# Patient Record
Sex: Male | Born: 1950 | Hispanic: Yes | Marital: Married | Smoking: Never smoker
Health system: Southern US, Community
[De-identification: ages and names within clinical notes are randomized; demographics above are authoritative.]

## PROBLEM LIST (undated history)

## (undated) DIAGNOSIS — I499 Cardiac arrhythmia, unspecified: Secondary | ICD-10-CM

## (undated) DIAGNOSIS — I10 Essential (primary) hypertension: Secondary | ICD-10-CM

## (undated) DIAGNOSIS — E119 Type 2 diabetes mellitus without complications: Secondary | ICD-10-CM

## (undated) HISTORY — PX: NO PAST SURGERIES: SHX2092

---

## 2005-06-19 ENCOUNTER — Ambulatory Visit: Payer: Self-pay

## 2007-04-22 ENCOUNTER — Emergency Department: Payer: Self-pay | Admitting: Unknown Physician Specialty

## 2007-07-08 ENCOUNTER — Emergency Department: Payer: Self-pay | Admitting: Emergency Medicine

## 2008-05-21 ENCOUNTER — Emergency Department: Payer: Self-pay | Admitting: Emergency Medicine

## 2008-12-14 ENCOUNTER — Emergency Department: Payer: Self-pay | Admitting: Emergency Medicine

## 2009-11-19 ENCOUNTER — Ambulatory Visit: Payer: Self-pay | Admitting: Ophthalmology

## 2009-12-02 ENCOUNTER — Ambulatory Visit: Payer: Self-pay | Admitting: Ophthalmology

## 2013-01-08 ENCOUNTER — Ambulatory Visit: Payer: Self-pay | Admitting: Gastroenterology

## 2013-01-09 LAB — PATHOLOGY REPORT

## 2014-12-20 ENCOUNTER — Encounter: Payer: Self-pay | Admitting: Emergency Medicine

## 2014-12-20 ENCOUNTER — Emergency Department
Admission: EM | Admit: 2014-12-20 | Discharge: 2014-12-20 | Disposition: A | Discharge status: Home / Self Care | Payer: BLUE CROSS/BLUE SHIELD | Attending: Emergency Medicine | Admitting: Emergency Medicine

## 2014-12-20 DIAGNOSIS — M5431 Sciatica, right side: Secondary | ICD-10-CM | POA: Diagnosis not present

## 2014-12-20 DIAGNOSIS — Z720 Tobacco use: Secondary | ICD-10-CM | POA: Insufficient documentation

## 2014-12-20 DIAGNOSIS — M549 Dorsalgia, unspecified: Secondary | ICD-10-CM | POA: Diagnosis present

## 2014-12-20 MED ORDER — HYDROCODONE-ACETAMINOPHEN 5-325 MG PO TABS: 1.0000 | ORAL_TABLET | ORAL | Status: DC | PRN

## 2014-12-20 MED ORDER — CYCLOBENZAPRINE HCL 10 MG PO TABS: 10.0000 mg | ORAL_TABLET | Freq: Three times a day (TID) | ORAL | Status: DC | PRN

## 2014-12-20 MED ORDER — PREDNISONE 10 MG (21) PO TBPK: ORAL_TABLET | ORAL | Status: DC

## 2014-12-20 NOTE — ED Provider Notes (Signed)
[facility] Emergency Department Provider Note ____________________________________________  Time seen: Approximately 2:20 PM  I have reviewed the triage vital signs and the nursing notes.   HISTORY  Chief Complaint Back Pain   HPI Bryan Shah is a 64 y.o. male who presents to the emergency department for evaluation of back pain. He states that the back pain, comes and goes. He works in [employer] and works many days in a row. He states this time the pain is in the right side and radiates down into his foot. He has not taken any medications at home for the pain.   History reviewed. No pertinent past medical history.  There are no active problems to display for this patient.   History reviewed. No pertinent past surgical history.  Current Outpatient Rx  Name  Route  Sig  Dispense  Refill  . cyclobenzaprine (FLEXERIL) 10 MG tablet   Oral   Take 1 tablet (10 mg total) by mouth 3 (three) times daily as needed for muscle spasms.   30 tablet   0   . HYDROcodone-acetaminophen (NORCO/VICODIN) 5-325 MG per tablet   Oral   Take 1 tablet by mouth every 4 (four) hours as needed.   12 tablet   0   . predniSONE (STERAPRED UNI-PAK 21 TAB) 10 MG (21) TBPK tablet      Take 6 tablets on day 1 Take 5 tablets on day 2 Take 4 tablets on day 3 Take 3 tablets on day 4 Take 2 tablets on day 5 Take 1 tablet on day 6   21 tablet   0     Allergies Review of patient's allergies indicates no known allergies.  No family history on file.  Social History Social History  Substance Use Topics  . Smoking status: Current Every Day Smoker  . Smokeless tobacco: None  . Alcohol Use: No    Review of Systems Constitutional: No recent illness. Eyes: No visual changes. ENT: No sore throat. Cardiovascular: Denies chest pain or palpitations. Respiratory: Denies shortness of breath. Gastrointestinal: No abdominal pain.  Genitourinary: Negative for  dysuria. Musculoskeletal: Pain in right lumbar area into the leg Skin: Negative for rash. Neurological: Negative for headaches, focal weakness or numbness. 10-point ROS otherwise negative.  ____________________________________________   PHYSICAL EXAM:  VITAL SIGNS: ED Triage Vitals  Enc Vitals Group     BP 12/20/14 0831 155/43 mmHg     Pulse Rate 12/20/14 0831 66     Resp 12/20/14 0831 18     Temp 12/20/14 0831 97.6 F (36.4 C)     Temp Source 12/20/14 0831 Oral     SpO2 12/20/14 0831 100 %     Weight 12/20/14 0841 240 lb (108.863 kg)     Height 12/20/14 0841 5\' 5"  (1.651 m)     Head Cir --      Peak Flow --      Pain Score 12/20/14 0843 7     Pain Loc --      Pain Edu? --      Excl. in GC? --     Constitutional: Alert and oriented. Well appearing and in no acute distress. Eyes: Conjunctivae are normal. EOMI. Head: Atraumatic. Nose: No congestion/rhinnorhea. Neck: No stridor.  Respiratory: Normal respiratory effort.   Musculoskeletal: Straight leg raise positive at approximately 35 on the right side. Negative on the left Neurologic:  Normal speech and language. No gross focal neurologic deficits are appreciated. Speech is normal. No gait instability. Skin:  Skin is warm, dry and intact. Atraumatic. Psychiatric: Mood and affect are normal. Speech and behavior are normal.  ____________________________________________   LABS (all labs ordered are listed, but only abnormal results are displayed)  Labs Reviewed - No data to display ____________________________________________  RADIOLOGY  Not indicated ____________________________________________   PROCEDURES  Procedure(s) performed: None   ____________________________________________   INITIAL IMPRESSION / ASSESSMENT AND PLAN / ED COURSE  Pertinent labs & imaging results that were available during my care of the patient were reviewed by me and considered in my medical decision making (see chart for  details).  Patient was advised to follow-up with orthopedics for symptoms that are not improving with medication. He was advised to return to the emergency room for symptoms change or worsen if he is unable schedule an appointment. ____________________________________________   FINAL CLINICAL IMPRESSION(S) / ED DIAGNOSES  Final diagnoses:  Sciatica, right       Chinita Pester, FNP 12/20/14 1422  Sharyn Creamer, MD 12/20/14 1526

## 2014-12-20 NOTE — ED Notes (Signed)
Pt c/o lower back pain that radiates into the left leg for the past 3-4 months.Marland Kitchen

## 2015-12-22 ENCOUNTER — Emergency Department: Payer: BLUE CROSS/BLUE SHIELD

## 2015-12-22 ENCOUNTER — Emergency Department
Admission: EM | Admit: 2015-12-22 | Discharge: 2015-12-22 | Disposition: A | Discharge status: Home / Self Care | Payer: BLUE CROSS/BLUE SHIELD | Attending: Emergency Medicine | Admitting: Emergency Medicine

## 2015-12-22 ENCOUNTER — Encounter: Payer: Self-pay | Admitting: Emergency Medicine

## 2015-12-22 ENCOUNTER — Other Ambulatory Visit: Payer: Self-pay

## 2015-12-22 DIAGNOSIS — M5432 Sciatica, left side: Secondary | ICD-10-CM

## 2015-12-22 DIAGNOSIS — M5431 Sciatica, right side: Secondary | ICD-10-CM

## 2015-12-22 DIAGNOSIS — M5441 Lumbago with sciatica, right side: Secondary | ICD-10-CM | POA: Insufficient documentation

## 2015-12-22 DIAGNOSIS — M5442 Lumbago with sciatica, left side: Secondary | ICD-10-CM | POA: Insufficient documentation

## 2015-12-22 DIAGNOSIS — M5440 Lumbago with sciatica, unspecified side: Secondary | ICD-10-CM

## 2015-12-22 DIAGNOSIS — M545 Low back pain: Secondary | ICD-10-CM | POA: Diagnosis present

## 2015-12-22 DIAGNOSIS — F17200 Nicotine dependence, unspecified, uncomplicated: Secondary | ICD-10-CM | POA: Insufficient documentation

## 2015-12-22 LAB — URINALYSIS COMPLETE WITH MICROSCOPIC (ARMC ONLY)
BACTERIA UA: NONE SEEN
Bilirubin Urine: NEGATIVE
GLUCOSE, UA: NEGATIVE mg/dL
Ketones, ur: NEGATIVE mg/dL
LEUKOCYTES UA: NEGATIVE
NITRITE: NEGATIVE
PH: 5 (ref 5.0–8.0)
Protein, ur: NEGATIVE mg/dL
SPECIFIC GRAVITY, URINE: 1.01 (ref 1.005–1.030)
Squamous Epithelial / LPF: NONE SEEN

## 2015-12-22 LAB — BASIC METABOLIC PANEL
ANION GAP: 4 — AB (ref 5–15)
BUN: 15 mg/dL (ref 6–20)
CALCIUM: 8.9 mg/dL (ref 8.9–10.3)
CO2: 31 mmol/L (ref 22–32)
CREATININE: 0.77 mg/dL (ref 0.61–1.24)
Chloride: 105 mmol/L (ref 101–111)
Glucose, Bld: 123 mg/dL — ABNORMAL HIGH (ref 65–99)
Potassium: 4 mmol/L (ref 3.5–5.1)
SODIUM: 140 mmol/L (ref 135–145)

## 2015-12-22 LAB — CBC WITH DIFFERENTIAL/PLATELET
BASOS PCT: 1 %
Basophils Absolute: 0.1 10*3/uL (ref 0–0.1)
EOS ABS: 0.2 10*3/uL (ref 0–0.7)
Eosinophils Relative: 3 %
HEMATOCRIT: 44.9 % (ref 40.0–52.0)
HEMOGLOBIN: 15.4 g/dL (ref 13.0–18.0)
Lymphocytes Relative: 37 %
Lymphs Abs: 2.7 10*3/uL (ref 1.0–3.6)
MCH: 30.6 pg (ref 26.0–34.0)
MCHC: 34.3 g/dL (ref 32.0–36.0)
MCV: 89 fL (ref 80.0–100.0)
MONOS PCT: 9 %
Monocytes Absolute: 0.6 10*3/uL (ref 0.2–1.0)
NEUTROS ABS: 3.8 10*3/uL (ref 1.4–6.5)
NEUTROS PCT: 50 %
Platelets: 194 10*3/uL (ref 150–440)
RBC: 5.04 MIL/uL (ref 4.40–5.90)
RDW: 13.5 % (ref 11.5–14.5)
WBC: 7.4 10*3/uL (ref 3.8–10.6)

## 2015-12-22 MED ORDER — NAPROXEN 500 MG PO TABS: 500.0000 mg | ORAL_TABLET | Freq: Two times a day (BID) | ORAL | 0 refills | Status: DC

## 2015-12-22 MED ORDER — METHOCARBAMOL 750 MG PO TABS: 750.0000 mg | ORAL_TABLET | Freq: Four times a day (QID) | ORAL | 0 refills | Status: DC

## 2015-12-22 MED ORDER — GABAPENTIN 300 MG PO CAPS: 300.0000 mg | ORAL_CAPSULE | Freq: Three times a day (TID) | ORAL | 2 refills | Status: DC

## 2015-12-22 NOTE — ED Notes (Addendum)
States he developed some lower back pain about 4-5 months ago  States pain moves into right leg  Denies any injury but states pain is worse today. Ambulates well to treatment room

## 2015-12-22 NOTE — ED Triage Notes (Signed)
Pt c/o back pain and hip pain radiating down his legs for 5 months; ambulatory with steady gait;

## 2015-12-22 NOTE — ED Provider Notes (Signed)
[facility] Emergency Department Provider Note  ____________________________________________  Time seen: Approximately 7:19 AM  I have reviewed the triage vital signs and the nursing notes.   HISTORY  Chief Complaint Back Pain and Hip Pain    HPI obtained via interpreter. Bryan Shah is a 65 y.o. male presents for evaluation of continuous upper back and low back pain radiating down both legs. 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 Patient states the pain is across back radiating down both legs with some burning, numbness and tingling associated with it. He reports taking ibuprofen only.Marland Kitchen. He denies any direct trauma. Patient additionally states that he has had having some mild chest pains on and off for the last couple weeks. Denies any shortness of breath. He reports having some urinary frequency especially at nighttime.   History reviewed. No pertinent past medical history.  There are no active problems to display for this patient.   History reviewed. No pertinent surgical history.  Prior to Admission medications   Medication Sig Start Date End Date Taking? Authorizing Provider  gabapentin (NEURONTIN) 300 MG capsule Take 1 capsule (300 mg total) by mouth 3 (three) times daily. 12/22/15 12/21/16  Charmayne Sheerharles M Arron Tetrault, PA-C  methocarbamol (ROBAXIN) 750 MG tablet Take 1 tablet (750 mg total) by mouth 4 (four) times daily. 12/22/15   Evangeline Dakinharles M Marl Seago, PA-C  naproxen (NAPROSYN) 500 MG tablet Take 1 tablet (500 mg total) by mouth 2 (two) times daily with a meal. 12/22/15   Evangeline Dakinharles M Loyalty Arentz, PA-C    Allergies Review of patient's allergies indicates no known allergies.  No family history on file.  Social History Social History  Substance Use Topics  . Smoking status: Current Every Day Smoker  . Smokeless tobacco: Never Used  . Alcohol use No    Review of Systems Constitutional: No fever/chills Cardiovascular: Positive chest  pain. Respiratory: Denies shortness of breath. Gastrointestinal: No abdominal pain.  No nausea, no vomiting.  No diarrhea.  No constipation. Genitourinary: Negative for dysuria. Positive for frequency especially at night. Musculoskeletal: Positive for both upper and lower back pain with radiation down bilateral legs. Skin: Negative for rash. Neurological: Negative for headaches, focal weakness or numbness.  10-point ROS otherwise negative.  ____________________________________________   PHYSICAL EXAM:  VITAL SIGNS: ED Triage Vitals [12/22/15 0659]  Enc Vitals Group     BP (!) 163/55     Pulse Rate (!) 54     Resp 18     Temp 97.9 F (36.6 C)     Temp Source Oral     SpO2 99 %     Weight 244 lb (110.7 kg)     Height      Head Circumference      Peak Flow      Pain Score      Pain Loc      Pain Edu?      Excl. in GC?     Constitutional: Alert and oriented. Well appearing and in no acute distress.tous. Neck: No stridor.Supple, full range of motion, nontender.   Cardiovascular: Normal rate, regular rhythm. Grossly normal heart sounds.  Good peripheral circulation. Respiratory: Normal respiratory effort.  No retractions. Lungs CTAB. Gastrointestinal: Soft and nontender. No distention. No abdominal bruits. No CVA tenderness. Musculoskeletal: No lower extremity tenderness nor edema.  No joint effusions. Straight leg raise negative bilaterally area distally neurovascularly intact with poor nail care noted on the feet. Neurologic:  Normal speech and language. No  gross focal neurologic deficits are appreciated. No gait instability. Skin:  Skin is warm, dry and intact. No rash noted. Psychiatric: Mood and affect are normal. Speech and behavior are normal.  ____________________________________________   LABS (all labs ordered are listed, but only abnormal results are displayed)  Labs Reviewed  BASIC METABOLIC PANEL - Abnormal; Notable for the following:       Result Value    Glucose, Bld 123 (*)    Anion gap 4 (*)    All other components within normal limits  URINALYSIS COMPLETEWITH MICROSCOPIC ([facility] ONLY) - Abnormal; Notable for the following:    Color, Urine STRAW (*)    APPearance CLEAR (*)    Hgb urine dipstick 1+ (*)    All other components within normal limits  CBC WITH DIFFERENTIAL/PLATELET   ____________________________________________  EKG  Negative for any acute STEMI. ____________________________________________  RADIOLOGY  IMPRESSION:  Negative for acute fracture or malalignment of the lumbar spine.    Grade 1 anterolisthesis of L4 on L5.    Developing facet hypertrophy spanning L4-S1.    ____________________________________________   PROCEDURES  Procedure(s) performed: None  Critical Care performed: No  ____________________________________________   INITIAL IMPRESSION / ASSESSMENT AND PLAN / ED COURSE  Pertinent labs & imaging results that were available during my care of the patient were reviewed by me and considered in my medical decision making (see chart for details). Review of the [place] CSRS was performed in accordance of the [facility] prior to dispensing any controlled drugs.  Recurrent low back pain with lumbar narrowing. We'll treat for an acute exacerbation of sciatica Rx given for gabapentin 3 times a day Naprosyn 500 mg twice a day. In addition patient will be given a mild muscle relaxer for muscle tightness and spasticity. He is to follow-up with a local health care provider or pain management if symptoms continue.  Clinical Course    ____________________________________________   FINAL CLINICAL IMPRESSION(S) / ED DIAGNOSES  Final diagnoses:  Midline low back pain with sciatica, sciatica laterality unspecified  Bilateral sciatica     This chart was dictated using voice recognition software/Dragon. Despite best efforts to proofread, errors can occur which can change the meaning. Any change was purely  unintentional.    Evangeline Dakin, PA-C 12/22/15 1610    Governor Rooks, MD 12/22/15 [phone]

## 2015-12-22 NOTE — ED Notes (Signed)
Also informed the PA that he has been having pain in chest off and on for about 2 weeks.

## 2016-07-28 ENCOUNTER — Emergency Department
Admission: EM | Admit: 2016-07-28 | Discharge: 2016-07-28 | Disposition: A | Discharge status: Home / Self Care | Payer: BLUE CROSS/BLUE SHIELD | Attending: Emergency Medicine | Admitting: Emergency Medicine

## 2016-07-28 DIAGNOSIS — K644 Residual hemorrhoidal skin tags: Secondary | ICD-10-CM | POA: Insufficient documentation

## 2016-07-28 DIAGNOSIS — F17200 Nicotine dependence, unspecified, uncomplicated: Secondary | ICD-10-CM | POA: Diagnosis not present

## 2016-07-28 DIAGNOSIS — K6289 Other specified diseases of anus and rectum: Secondary | ICD-10-CM | POA: Diagnosis present

## 2016-07-28 MED ORDER — HYDROCORTISONE ACETATE 25 MG RE SUPP: 25.0000 mg | Freq: Two times a day (BID) | RECTAL | 1 refills | Status: AC | PRN

## 2016-07-28 MED ORDER — HYDROCORTISONE ACETATE 25 MG RE SUPP
25.0000 mg | Freq: Once | RECTAL | Status: AC
  Administered 2016-07-28: 25 mg via RECTAL
  Filled 2016-07-28: qty 1

## 2016-07-28 MED ORDER — LIDOCAINE VISCOUS 2 % MT SOLN
15.0000 mL | Freq: Once | OROMUCOSAL | Status: AC
  Administered 2016-07-28: 15 mL via OROMUCOSAL
  Filled 2016-07-28: qty 15

## 2016-07-28 NOTE — ED Notes (Addendum)
Pharmacy called about [facility], states they will tube it to the ED.

## 2016-07-28 NOTE — ED Provider Notes (Signed)
[facility] Emergency Department Provider Note   First MD Initiated Contact with Patient 07/28/16 [phone]     (approximate)  I have reviewed the triage vital signs and the nursing notes.   HISTORY  Chief Complaint Abscess    HPI Bryan Shah is a 66 y.o. male presents to the emergency department with painful "pimple" to his buttocks. Patient states that he's had a pimple adjacent to his anus for approximately a year however since Monday there has become tender. Patient states current pain score is 5 out of 10. Patient denies any fever  Past medical history None There are no active problems to display for this patient.   Past Surgical history None  Prior to Admission medications   Medication Sig Start Date End Date Taking? Authorizing Provider  gabapentin (NEURONTIN) 300 MG capsule Take 1 capsule (300 mg total) by mouth 3 (three) times daily. 12/22/15 12/21/16  Charmayne Sheer Beers, PA-C  hydrocortisone (ANUSOL-HC) 25 MG suppository Place 1 suppository (25 mg total) rectally 2 (two) times daily as needed for hemorrhoids or itching. 07/28/16 08/03/16  Darci Current, MD  methocarbamol (ROBAXIN) 750 MG tablet Take 1 tablet (750 mg total) by mouth 4 (four) times daily. 12/22/15   Evangeline Dakin, PA-C  naproxen (NAPROSYN) 500 MG tablet Take 1 tablet (500 mg total) by mouth 2 (two) times daily with a meal. 12/22/15   Evangeline Dakin, PA-C    Allergies No known drug allergies No family history on file.  Social History Social History  Substance Use Topics  . Smoking status: Current Every Day Smoker  . Smokeless tobacco: Never Used  . Alcohol use No    Review of Systems Constitutional: No fever/chills Eyes: No visual changes. ENT: No sore throat. Cardiovascular: Denies chest pain. Respiratory: Denies shortness of breath. Gastrointestinal: No abdominal pain.  No nausea, no vomiting.  No diarrhea.  No constipation. Positive for "pimple" adjacent  anus Genitourinary: Negative for dysuria. Musculoskeletal: Negative for back pain. Skin: Negative for rash. Neurological: Negative for headaches, focal weakness or numbness.  10-point ROS otherwise negative.  ____________________________________________   PHYSICAL EXAM:  VITAL SIGNS: ED Triage Vitals  Enc Vitals Group     BP 07/28/16 0210 (!) 179/53     Pulse Rate 07/28/16 0209 60     Resp 07/28/16 0209 18     Temp 07/28/16 0209 98.4 F (36.9 C)     Temp Source 07/28/16 0209 Oral     SpO2 07/28/16 0209 96 %     Weight 07/28/16 0209 240 lb (108.9 kg)     Height --      Head Circumference --      Peak Flow --      Pain Score 07/28/16 0208 5     Pain Loc --      Pain Edu? --      Excl. in GC? --     Constitutional: Alert and oriented. Well appearing and in no acute distress. Eyes: Conjunctivae are normal. PERRL. EOMI. Head: Atraumatic. Cardiovascular: Normal rate, regular rhythm. Good peripheral circulation. Grossly normal heart sounds. Respiratory: Normal respiratory effort.  No retractions. Lungs CTAB. Gastrointestinal: Soft and nontender. No distention. External hemorrhoid noted at the 9:00 position Musculoskeletal: No lower extremity tenderness nor edema. No gross deformities of extremities. Neurologic:  Normal speech and language. No gross focal neurologic deficits are appreciated.  Skin:  Skin is warm, dry and intact. No rash noted.     Procedures  ____________________________________________   INITIAL IMPRESSION / ASSESSMENT AND PLAN / ED COURSE  Pertinent labs & imaging results that were available during my care of the patient were reviewed by me and considered in my medical decision making (see chart for details).  Patient given Anusol suppository the emergency department and will be prescribed same for home. Patient is referred to Dr. Excell Seltzer for further outpatient management.      ____________________________________________  FINAL CLINICAL  IMPRESSION(S) / ED DIAGNOSES  Final diagnoses:  External hemorrhoid     MEDICATIONS GIVEN DURING THIS VISIT:  Medications  hydrocortisone (ANUSOL-HC) suppository 25 mg (not administered)  lidocaine (XYLOCAINE) 2 % viscous mouth solution 15 mL (15 mLs Mouth/Throat Given 07/28/16 0259)     NEW OUTPATIENT MEDICATIONS STARTED DURING THIS VISIT:  New Prescriptions   HYDROCORTISONE (ANUSOL-HC) 25 MG SUPPOSITORY    Place 1 suppository (25 mg total) rectally 2 (two) times daily as needed for hemorrhoids or itching.    Modified Medications   No medications on file    Discontinued Medications   No medications on file     Note:  This document was prepared using Dragon voice recognition software and may include unintentional dictation errors.    Darci Current, MD 07/28/16 [phone]

## 2016-07-28 NOTE — ED Notes (Signed)
See triage note, pt reports for the past year and a half having intermittent buttock pain. Pt states more recently the pain and size of inflamed area has increased. Pt denies drainage or fever. EDP in rm.

## 2016-07-28 NOTE — ED Triage Notes (Signed)
Pt states for over a year has had a "pimple" to buttocks, states Monday it started becoming tender and inflamed.

## 2016-07-30 ENCOUNTER — Encounter: Payer: Self-pay | Admitting: Emergency Medicine

## 2016-07-30 ENCOUNTER — Emergency Department
Admission: EM | Admit: 2016-07-30 | Discharge: 2016-07-30 | Disposition: A | Discharge status: Home / Self Care | Payer: BLUE CROSS/BLUE SHIELD | Attending: Emergency Medicine | Admitting: Emergency Medicine

## 2016-07-30 DIAGNOSIS — F17200 Nicotine dependence, unspecified, uncomplicated: Secondary | ICD-10-CM | POA: Diagnosis not present

## 2016-07-30 DIAGNOSIS — K649 Unspecified hemorrhoids: Secondary | ICD-10-CM | POA: Insufficient documentation

## 2016-07-30 LAB — CBC
HCT: 44.5 % (ref 40.0–52.0)
HEMOGLOBIN: 15.1 g/dL (ref 13.0–18.0)
MCH: 30.2 pg (ref 26.0–34.0)
MCHC: 33.9 g/dL (ref 32.0–36.0)
MCV: 89.3 fL (ref 80.0–100.0)
PLATELETS: 195 10*3/uL (ref 150–440)
RBC: 4.99 MIL/uL (ref 4.40–5.90)
RDW: 13.2 % (ref 11.5–14.5)
WBC: 8.9 10*3/uL (ref 3.8–10.6)

## 2016-07-30 LAB — BASIC METABOLIC PANEL
Anion gap: 6 (ref 5–15)
BUN: 19 mg/dL (ref 6–20)
CHLORIDE: 105 mmol/L (ref 101–111)
CO2: 29 mmol/L (ref 22–32)
Calcium: 9.3 mg/dL (ref 8.9–10.3)
Creatinine, Ser: 0.97 mg/dL (ref 0.61–1.24)
GFR calc Af Amer: >60 mL/min (ref >60)
GFR calc non Af Amer: >60 mL/min (ref >60)
Glucose, Bld: 100 mg/dL — ABNORMAL HIGH (ref 65–99)
Potassium: 4.1 mmol/L (ref 3.5–5.1)
SODIUM: 140 mmol/L (ref 135–145)

## 2016-07-30 NOTE — ED Notes (Signed)
Pt states was here 2 days ago and diagnosed with hemmorrhoids. Pt states he had a friend of a relative that had similar symptoms and was diagnosed with cancer. Pt has bright red blood noted on pad under where pt was sitting approx a dime sized worth. Pt states has "a little" pain with bowel movement.

## 2016-07-30 NOTE — ED Provider Notes (Signed)
[facility] Emergency Department Provider Note  Time seen: 8:37 PM  I have reviewed the triage vital signs and the nursing notes.   HISTORY  Chief Complaint Hemorrhoids    HPI Bryan Shah is a 66 y.o. male who presents to the emergency department for rectal bleeding. According to the patient he was seen in the emergency department2 days ago and diagnosed with external hemorrhoids. Patient was given hydrocortisone suppositories. Patient states he has not used these medications yet. He states he continued to have some bleeding with bowel movements today and got concerned because a family member had similar symptoms and was ultimately diagnosed with cancer so he came back to the emergency department for evaluation. States mild discomfort during bowel movement. Denies any black stool, states normal color. Denies abdominal pain nausea vomiting or fever.  History reviewed. No pertinent past medical history.  There are no active problems to display for this patient.   History reviewed. No pertinent surgical history.  Prior to Admission medications   Medication Sig Start Date End Date Taking? Authorizing Provider  gabapentin (NEURONTIN) 300 MG capsule Take 1 capsule (300 mg total) by mouth 3 (three) times daily. 12/22/15 12/21/16  Charmayne Sheer Beers, PA-C  hydrocortisone (ANUSOL-HC) 25 MG suppository Place 1 suppository (25 mg total) rectally 2 (two) times daily as needed for hemorrhoids or itching. 07/28/16 08/03/16  Darci Current, MD  methocarbamol (ROBAXIN) 750 MG tablet Take 1 tablet (750 mg total) by mouth 4 (four) times daily. 12/22/15   Evangeline Dakin, PA-C  naproxen (NAPROSYN) 500 MG tablet Take 1 tablet (500 mg total) by mouth 2 (two) times daily with a meal. 12/22/15   Evangeline Dakin, PA-C    No Known Allergies  No family history on file.  Social History Social History  Substance Use Topics  . Smoking status: Current Every Day Smoker  .  Smokeless tobacco: Never Used  . Alcohol use No    Review of Systems Constitutional: Negative for fever. Cardiovascular: Negative for chest pain. Gastrointestinal: Negative for abdominal pain Genitourinary: Negative for dysuria. 10-point ROS otherwise negative.  ____________________________________________   PHYSICAL EXAM:  VITAL SIGNS: ED Triage Vitals  Enc Vitals Group     BP 07/30/16 1817 (!) 175/74     Pulse Rate 07/30/16 1817 60     Resp 07/30/16 1817 16     Temp 07/30/16 1817 99.3 F (37.4 C)     Temp Source 07/30/16 1817 Oral     SpO2 07/30/16 1817 95 %     Weight --      Height --      Head Circumference --      Peak Flow --      Pain Score 07/30/16 1816 5     Pain Loc --      Pain Edu? --      Excl. in GC? --     Constitutional: Alert and oriented. Well appearing and in no distress. Eyes: Normal exam ENT   Head: Normocephalic and atraumatic.   Mouth/Throat: Mucous membranes are moist. Cardiovascular: Normal rate, regular rhythm. No murmurs, rubs, or gallops. Respiratory: Normal respiratory effort without tachypnea nor retractions. Breath sounds are clear Gastrointestinal: Soft and nontender. No distention. Rectal examination shows one moderate-sized external hemorrhoid with mild active bleeding when palpated. No thrombosed hemorrhoids. Rectal examination shows light brown stool. Largely nontender. Musculoskeletal: Nontender with normal range of motion in all extremities.  Neurologic:  Normal speech and language. No gross focal  neurologic deficits Skin:  Skin is warm, dry and intact.  Psychiatric: Mood and affect are normal.   ____________________________________________    INITIAL IMPRESSION / ASSESSMENT AND PLAN / ED COURSE  Pertinent labs & imaging results that were available during my care of the patient were reviewed by me and considered in my medical decision making (see chart for details).  Patient presents emergency department for  hemorrhoidal bleeding. On exam the patient examination is most consistent with an external hemorrhoid. Stool appears to be normal color. During rectal exam and when pushing on the hemorrhoid there is a mild amount of bleeding. Patient's labs are unchanged. I discussed with the patient importance of following up with GI medicine for a colonoscopy. Patient is agreeable to plan. He will also continue to use his suppositories as needed, as prescribed. Patient has no abdominal pain do not believe further imaging at this time would be of much benefit for the patient and I believe a colonoscopy is ultimately required as the patient has never had a colonoscopy.  ____________________________________________   FINAL CLINICAL IMPRESSION(S) / ED DIAGNOSES  Hemorrhoidal bleeding    Minna Antis, MD 07/30/16 2040

## 2016-07-30 NOTE — ED Notes (Signed)
Information obtained with rafeal, interpreter.

## 2016-07-30 NOTE — ED Triage Notes (Signed)
Pt seen in ED 2 days ago and diagnosed with hemorrhoids. Pt states that he is concerned because he started having bleeding when he used the restroom. Pt states there was a small amount of blood on the toilet paper, denies passing blood in the toilet. Pt states that it was painful he had a BM.

## 2017-08-05 ENCOUNTER — Encounter: Payer: Self-pay | Admitting: Emergency Medicine

## 2017-08-05 ENCOUNTER — Other Ambulatory Visit: Payer: Self-pay

## 2017-08-05 ENCOUNTER — Emergency Department
Admission: EM | Admit: 2017-08-05 | Discharge: 2017-08-05 | Disposition: A | Discharge status: Home / Self Care | Payer: BLUE CROSS/BLUE SHIELD | Attending: Emergency Medicine | Admitting: Emergency Medicine

## 2017-08-05 DIAGNOSIS — F17200 Nicotine dependence, unspecified, uncomplicated: Secondary | ICD-10-CM | POA: Insufficient documentation

## 2017-08-05 DIAGNOSIS — M5431 Sciatica, right side: Secondary | ICD-10-CM | POA: Insufficient documentation

## 2017-08-05 LAB — URINALYSIS, COMPLETE (UACMP) WITH MICROSCOPIC
Bilirubin Urine: NEGATIVE
GLUCOSE, UA: NEGATIVE mg/dL
KETONES UR: NEGATIVE mg/dL
NITRITE: NEGATIVE
PH: 5 (ref 5.0–8.0)
PROTEIN: NEGATIVE mg/dL
Specific Gravity, Urine: 1.021 (ref 1.005–1.030)

## 2017-08-05 MED ORDER — KETOROLAC TROMETHAMINE 30 MG/ML IJ SOLN
30.0000 mg | Freq: Once | INTRAMUSCULAR | Status: AC
  Administered 2017-08-05: 30 mg via INTRAMUSCULAR
  Filled 2017-08-05: qty 1

## 2017-08-05 MED ORDER — PREDNISONE 10 MG PO TABS: ORAL_TABLET | ORAL | 0 refills | Status: DC

## 2017-08-05 NOTE — Discharge Instructions (Addendum)
Take medication as directed Call one of the clinics on the list

## 2017-08-05 NOTE — ED Notes (Signed)
First Nurse Note:  Patient indicates that his right hip is hurting him. Spanish Interpreter requested.

## 2017-08-05 NOTE — ED Provider Notes (Signed)
[facility] Emergency Department Provider Note  ____________________________________________   First MD Initiated Contact with Patient 08/05/17 [phone]     (approximate)  I have reviewed the triage vital signs and the nursing notes.   HISTORY  Chief Complaint Hip Pain   HPI Bryan Shah is a 67 y.o. male presents to the emergency department with right-sided sciatica for the last 3-4 years.  Patient states it comes and goes in for the past couple months is gotten worse.  He has taken over-the-counter medication and frequently for his sciatica without any relief.  He states he does not have a PCP.  He is concerned because recently her friend with similar symptoms was diagnosed with kidney cancer and died.  Patient denies any history of diabetes or injury to his back or hip.  He rates his pain as 5/10.  History reviewed. No pertinent past medical history.  There are no active problems to display for this patient.   History reviewed. No pertinent surgical history.  Prior to Admission medications   Medication Sig Start Date End Date Taking? Authorizing Provider  gabapentin (NEURONTIN) 300 MG capsule Take 1 capsule (300 mg total) by mouth 3 (three) times daily. 12/22/15 12/21/16  Beers, Charmayne Sheerharles M, PA-C  predniSONE (DELTASONE) 10 MG tablet Take 6 tablets  today, on day 2 take 5 tablets, day 3 take 4 tablets, day 4 take 3 tablets, day 5 take  2 tablets and 1 tablet the last day 08/05/17   Tommi RumpsSummers, Rhonda L, PA-C    Allergies Patient has no known allergies.  No family history on file.  Social History Social History   Tobacco Use  . Smoking status: Current Every Day Smoker  . Smokeless tobacco: Never Used  Substance Use Topics  . Alcohol use: No  . Drug use: Not on file    Review of Systems Constitutional: No fever/chills Cardiovascular: Denies chest pain. Respiratory: Denies shortness of breath. Gastrointestinal: No abdominal pain.  No  nausea, no vomiting. Genitourinary: Negative for dysuria. Musculoskeletal: Positive for back pain and right leg radiculopathy. Skin: Negative for rash. Neurological: Negative for  focal weakness or numbness. ____________________________________________   PHYSICAL EXAM:  VITAL SIGNS: ED Triage Vitals  Enc Vitals Group     BP 08/05/17 0833 (!) 152/51     Pulse Rate 08/05/17 0833 (!) 53     Resp 08/05/17 0833 18     Temp 08/05/17 0833 98.2 F (36.8 C)     Temp Source 08/05/17 0833 Oral     SpO2 08/05/17 0833 98 %     Weight 08/05/17 0839 245 lb (111.1 kg)     Height 08/05/17 0839 5\' 7"  (1.702 m)     Head Circumference --      Peak Flow --      Pain Score 08/05/17 0839 5     Pain Loc --      Pain Edu? --      Excl. in GC? --    Constitutional: Alert and oriented. Well appearing and in no acute distress.  Patient obese. Eyes: Conjunctivae are normal.  Head: Atraumatic. Neck: No stridor.   Cardiovascular: Normal rate, regular rhythm. Grossly normal heart sounds.  Good peripheral circulation. Respiratory: Normal respiratory effort.  No retractions. Lungs CTAB. Gastrointestinal: Soft and nontender. No distention. No CVA tenderness. Musculoskeletal: Examination of the back there is no gross deformity and no point tenderness on palpation of the thoracic and  lumbar spine.  There is moderate tenderness on  palpation of the right SI joint area and soft tissue.  Straight leg raises on the right are restricted at approximately 40 degrees secondary to patient's pain.  Good muscle strength bilaterally.  Patient is ambulatory without assistance. Neurologic:  Normal speech and language. No gross focal neurologic deficits are appreciated.  Reflexes 1+ bilaterally.  No gait instability. Skin:  Skin is warm, dry and intact. No rash noted. Psychiatric: Mood and affect are normal. Speech and behavior are normal.  ____________________________________________   LABS (all labs ordered are listed,  but only abnormal results are displayed)  Labs Reviewed  URINALYSIS, COMPLETE (UACMP) WITH MICROSCOPIC - Abnormal; Notable for the following components:      Result Value   Color, Urine YELLOW (*)    APPearance CLEAR (*)    Hgb urine dipstick SMALL (*)    Leukocytes, UA SMALL (*)    All other components within normal limits     PROCEDURES  Procedure(s) performed: None  Procedures  Critical Care performed: No  ____________________________________________   INITIAL IMPRESSION / ASSESSMENT AND PLAN / ED COURSE  As part of my medical decision making, I reviewed the following data within the electronic MEDICAL RECORD NUMBER Notes from prior ED visits and [place] Controlled Substance Database  Patient is here with complaint of chronic back pain with right sided sciatica.  Patient was reassured that urinalysis did not show any things suspicious for kidney stone or infection.  He was given a list of clinics in the area to call and establish primary care.  Patient was given Toradol while in the department since he is driving.  He was discharged with a tapering dose of prednisone to begin taking today.  ____________________________________________   FINAL CLINICAL IMPRESSION(S) / ED DIAGNOSES  Final diagnoses:  Sciatica of right side     ED Discharge Orders        Ordered    predniSONE (DELTASONE) 10 MG tablet     08/05/17 0940       Note:  This document was prepared using Dragon voice recognition software and may include unintentional dictation errors.    Tommi Rumps, PA-C 08/05/17 1112    Darci Current, MD 08/05/17 1145

## 2017-08-05 NOTE — ED Notes (Signed)
Patient is complaining of right hip pain x 3 months.  Patient reports concern because his friend had similar symptoms and then died of cancer.  Patient is in no obvious distress at this time.  Walking with a steady gait.

## 2017-08-05 NOTE — ED Triage Notes (Signed)
Pt states that he has been having rt sided sciatic nerve pain for the past 3-4 years. Pt states that it comes and goes, pt states that for the past couple months it has been worse, pt also concerned because a friend of his had similar symptoms and was diagnosed with kidney cancer and died so he is hoping to have a full annual check up

## 2017-11-04 ENCOUNTER — Encounter: Payer: Self-pay | Admitting: Family Medicine

## 2017-11-22 ENCOUNTER — Encounter: Payer: Self-pay | Admitting: *Deleted

## 2017-12-01 ENCOUNTER — Other Ambulatory Visit: Payer: Self-pay

## 2017-12-01 DIAGNOSIS — R195 Other fecal abnormalities: Secondary | ICD-10-CM

## 2017-12-01 NOTE — Progress Notes (Signed)
Colonoscopy scheduled and triaged with the assistance of Johnella MoloneyFabiola at [facility] Phone # [phone] Fax [phone].  Instructions (Spanish) were faxed to patient while at office visit [facility] reviewed instructions with Johnella MoloneyFabiola.  [facility]  Thanks Marcelino DusterMichelle

## 2017-12-09 ENCOUNTER — Telehealth: Payer: Self-pay

## 2017-12-12 ENCOUNTER — Encounter: Admission: RE | Payer: Self-pay | Source: Ambulatory Visit

## 2017-12-12 ENCOUNTER — Ambulatory Visit: Admission: RE | Admit: 2017-12-12 | Payer: Self-pay | Source: Ambulatory Visit | Admitting: Gastroenterology

## 2017-12-12 SURGERY — COLONOSCOPY WITH PROPOFOL
Anesthesia: General

## 2017-12-12 NOTE — Telephone Encounter (Signed)
Opened in Error.

## 2019-01-11 ENCOUNTER — Emergency Department: Payer: Self-pay

## 2019-01-11 ENCOUNTER — Emergency Department
Admission: EM | Admit: 2019-01-11 | Discharge: 2019-01-11 | Disposition: A | Discharge status: Home / Self Care | Payer: Self-pay | Attending: Emergency Medicine | Admitting: Emergency Medicine

## 2019-01-11 ENCOUNTER — Other Ambulatory Visit: Payer: Self-pay

## 2019-01-11 ENCOUNTER — Encounter: Payer: Self-pay | Admitting: *Deleted

## 2019-01-11 DIAGNOSIS — Y929 Unspecified place or not applicable: Secondary | ICD-10-CM | POA: Insufficient documentation

## 2019-01-11 DIAGNOSIS — T1490XA Injury, unspecified, initial encounter: Secondary | ICD-10-CM

## 2019-01-11 DIAGNOSIS — F17200 Nicotine dependence, unspecified, uncomplicated: Secondary | ICD-10-CM | POA: Insufficient documentation

## 2019-01-11 DIAGNOSIS — Z23 Encounter for immunization: Secondary | ICD-10-CM | POA: Insufficient documentation

## 2019-01-11 DIAGNOSIS — E119 Type 2 diabetes mellitus without complications: Secondary | ICD-10-CM | POA: Insufficient documentation

## 2019-01-11 DIAGNOSIS — Y9389 Activity, other specified: Secondary | ICD-10-CM | POA: Insufficient documentation

## 2019-01-11 DIAGNOSIS — W208XXA Other cause of strike by thrown, projected or falling object, initial encounter: Secondary | ICD-10-CM | POA: Insufficient documentation

## 2019-01-11 DIAGNOSIS — Y999 Unspecified external cause status: Secondary | ICD-10-CM | POA: Insufficient documentation

## 2019-01-11 DIAGNOSIS — S92314B Nondisplaced fracture of first metatarsal bone, right foot, initial encounter for open fracture: Secondary | ICD-10-CM | POA: Insufficient documentation

## 2019-01-11 MED ORDER — SULFAMETHOXAZOLE-TRIMETHOPRIM 800-160 MG PO TABS: 1.0000 | ORAL_TABLET | Freq: Two times a day (BID) | ORAL | 0 refills | Status: DC

## 2019-01-11 MED ORDER — TETANUS-DIPHTH-ACELL PERTUSSIS 5-2.5-18.5 LF-MCG/0.5 IM SUSP
0.5000 mL | Freq: Once | INTRAMUSCULAR | Status: AC
  Administered 2019-01-11: 18:00:00 0.5 mL via INTRAMUSCULAR
  Filled 2019-01-11: qty 0.5

## 2019-01-11 MED ORDER — BACITRACIN ZINC 500 UNIT/GM EX OINT
1.0000 "application " | TOPICAL_OINTMENT | Freq: Once | CUTANEOUS | Status: AC
  Administered 2019-01-11: 1 via TOPICAL
  Filled 2019-01-11: qty 0.9

## 2019-01-11 MED ORDER — NAPROXEN 500 MG PO TABS
500.0000 mg | ORAL_TABLET | Freq: Once | ORAL | Status: AC
  Administered 2019-01-11: 18:00:00 500 mg via ORAL
  Filled 2019-01-11: qty 1

## 2019-01-11 MED ORDER — NAPROXEN 500 MG PO TABS: 500.0000 mg | ORAL_TABLET | Freq: Two times a day (BID) | ORAL | 0 refills | Status: DC

## 2019-01-11 NOTE — ED Provider Notes (Signed)
[facility] Emergency Department Provider Note ____________________________________________  Time seen: Approximately 4:41 PM  I have reviewed the triage vital signs and the nursing notes.   HISTORY  Chief Complaint Foot Injury    HPI Bryan Shah is a 68 y.o. male who presents to the emergency department for evaluation and treatment of pain in the right foot and great toe. He dropped a propane tank on it earlier today. Pain since. No alleviating measures prior to arrival. He is a diabetic.   No past medical history on file.  There are no active problems to display for this patient.   No past surgical history on file.  Prior to Admission medications   Medication Sig Start Date End Date Taking? Authorizing Provider  gabapentin (NEURONTIN) 300 MG capsule Take 1 capsule (300 mg total) by mouth 3 (three) times daily. 12/22/15 12/21/16  Beers, Charmayne Sheer, PA-C  naproxen (NAPROSYN) 500 MG tablet Take 1 tablet (500 mg total) by mouth 2 (two) times daily with a meal. 01/11/19   Bryan Morin B, FNP  predniSONE (DELTASONE) 10 MG tablet Take 6 tablets  today, on day 2 take 5 tablets, day 3 take 4 tablets, day 4 take 3 tablets, day 5 take  2 tablets and 1 tablet the last day 08/05/17   Bryan Rumps, PA-C  sulfamethoxazole-trimethoprim (BACTRIM DS) 800-160 MG tablet Take 1 tablet by mouth 2 (two) times daily. 01/11/19   Bryan Pester, FNP    Allergies Patient has no known allergies.  No family history on file.  Social History Social History   Tobacco Use  . Smoking status: Current Every Day Smoker  . Smokeless tobacco: Never Used  Substance Use Topics  . Alcohol use: No  . Drug use: Not on file    Review of Systems Constitutional: Negative for fever. Cardiovascular: Negative for chest pain. Respiratory: Negative for shortness of breath. Musculoskeletal: Positive for right great toe and foot pain. Skin: Positive for abrasion to the right  great toe.  Neurological: Negative for decrease in sensation  ____________________________________________   PHYSICAL EXAM:  VITAL SIGNS: ED Triage Vitals  Enc Vitals Group     BP 01/11/19 1607 (!) 142/52     Pulse Rate 01/11/19 1607 (!) 53     Resp 01/11/19 1607 20     Temp 01/11/19 1607 98.4 F (36.9 C)     Temp Source 01/11/19 1607 Oral     SpO2 01/11/19 1607 97 %     Weight --      Height --      Head Circumference --      Peak Flow --      Pain Score 01/11/19 1605 6     Pain Loc --      Pain Edu? --      Excl. in GC? --     Constitutional: Alert and oriented. Well appearing and in no acute distress. Eyes: Conjunctivae are clear without discharge or drainage Head: Atraumatic Neck: Supple Respiratory: No cough. Respirations are even and unlabored. Musculoskeletal: Pain in the right foot with ROM. Neurologic: Awake, alert, and oriented.  Skin: 36mm laceration to the inner aspect of the great toe with scant active bleeding.  Psychiatric: Affect and behavior are appropriate.  ____________________________________________   LABS (all labs ordered are listed, but only abnormal results are displayed)  Labs Reviewed - No data to display ____________________________________________  RADIOLOGY  Questionable cortical lucency in the proximal aspect of the first proximal and distal  phalanx.   ____________________________________________   PROCEDURES  Procedures  ____________________________________________   INITIAL IMPRESSION / ASSESSMENT AND PLAN / ED COURSE  Bryan Shah is a 68 y.o. who presents to the emergency department for treatment and evaluation of foot injury after dropping a propane tank.  Questionable fractures are noted in the right great toe.  He also has a laceration in the medial aspect.  He is diabetic with a possible open fracture therefore he will be treated with antibiotics.  He will also be placed in a postop shoe and given strict  wound care instructions.  He is to follow-up with podiatry for symptoms that are not improving over the next week or so.  He is to see them sooner or return to the emergency department for concerns.   Medications  bacitracin ointment 1 application (1 application Topical Given 01/11/19 1809)  naproxen (NAPROSYN) tablet 500 mg (500 mg Oral Given 01/11/19 1757)  Tdap (BOOSTRIX) injection 0.5 mL (0.5 mLs Intramuscular Given 01/11/19 1759)    Pertinent labs & imaging results that were available during my care of the patient were reviewed by me and considered in my medical decision making (see chart for details).  _________________________________________   FINAL CLINICAL IMPRESSION(S) / ED DIAGNOSES  Final diagnoses:  Injury  Open nondisplaced fracture of first metatarsal bone of right foot, initial encounter    ED Discharge Orders         Ordered    sulfamethoxazole-trimethoprim (BACTRIM DS) 800-160 MG tablet  2 times daily     01/11/19 1719    naproxen (NAPROSYN) 500 MG tablet  2 times daily with meals     01/11/19 1719           If controlled substance prescribed during this visit, 12 month history viewed on the [place] prior to issuing an initial prescription for Schedule II or III opiod.   Bryan Dike, FNP 01/11/19 2135    Bryan Kitten, MD 01/12/19 [phone]

## 2019-01-11 NOTE — ED Triage Notes (Signed)
Pt to triage via wheelchair.  Pt states a gas tank fell on his right foot.  Pt has a cut to right great toe.  Pt was wearing sandals.   Interpreter with pt.  Hx diabetes.

## 2019-07-12 DIAGNOSIS — R531 Weakness: Secondary | ICD-10-CM | POA: Insufficient documentation

## 2019-10-01 ENCOUNTER — Other Ambulatory Visit: Payer: Self-pay

## 2019-10-01 ENCOUNTER — Emergency Department: Payer: Self-pay

## 2019-10-01 ENCOUNTER — Emergency Department
Admission: EM | Admit: 2019-10-01 | Discharge: 2019-10-01 | Disposition: A | Discharge status: Home / Self Care | Payer: Self-pay | Attending: Emergency Medicine | Admitting: Emergency Medicine

## 2019-10-01 DIAGNOSIS — K529 Noninfective gastroenteritis and colitis, unspecified: Secondary | ICD-10-CM | POA: Insufficient documentation

## 2019-10-01 DIAGNOSIS — R935 Abnormal findings on diagnostic imaging of other abdominal regions, including retroperitoneum: Secondary | ICD-10-CM | POA: Insufficient documentation

## 2019-10-01 DIAGNOSIS — R911 Solitary pulmonary nodule: Secondary | ICD-10-CM | POA: Insufficient documentation

## 2019-10-01 DIAGNOSIS — A045 Campylobacter enteritis: Secondary | ICD-10-CM | POA: Insufficient documentation

## 2019-10-01 DIAGNOSIS — F17210 Nicotine dependence, cigarettes, uncomplicated: Secondary | ICD-10-CM | POA: Insufficient documentation

## 2019-10-01 DIAGNOSIS — N3289 Other specified disorders of bladder: Secondary | ICD-10-CM

## 2019-10-01 LAB — CBC
HCT: 41 % (ref 39.0–52.0)
Hemoglobin: 14.1 g/dL (ref 13.0–17.0)
MCH: 29.1 pg (ref 26.0–34.0)
MCHC: 34.4 g/dL (ref 30.0–36.0)
MCV: 84.7 fL (ref 80.0–100.0)
Platelets: 155 K/uL (ref 150–400)
RBC: 4.84 MIL/uL (ref 4.22–5.81)
RDW: 14 % (ref 11.5–15.5)
WBC: 13.4 K/uL — ABNORMAL HIGH (ref 4.0–10.5)
nRBC: 0 % (ref 0.0–0.2)

## 2019-10-01 LAB — GASTROINTESTINAL PANEL BY PCR, STOOL (REPLACES STOOL CULTURE)

## 2019-10-01 LAB — C DIFFICILE QUICK SCREEN W PCR REFLEX
C Diff antigen: NEGATIVE
C Diff interpretation: NOT DETECTED
C Diff toxin: NEGATIVE

## 2019-10-01 LAB — BASIC METABOLIC PANEL WITH GFR
Anion gap: 9 (ref 5–15)
BUN: 28 mg/dL — ABNORMAL HIGH (ref 8–23)
CO2: 22 mmol/L (ref 22–32)
Calcium: 8.5 mg/dL — ABNORMAL LOW (ref 8.9–10.3)
Chloride: 105 mmol/L (ref 98–111)
Creatinine, Ser: 1.35 mg/dL — ABNORMAL HIGH (ref 0.61–1.24)
GFR calc Af Amer: >60 mL/min
GFR calc non Af Amer: 54 mL/min — ABNORMAL LOW
Glucose, Bld: 146 mg/dL — ABNORMAL HIGH (ref 70–99)
Potassium: 3.3 mmol/L — ABNORMAL LOW (ref 3.5–5.1)
Sodium: 136 mmol/L (ref 135–145)

## 2019-10-01 LAB — HEPATIC FUNCTION PANEL
ALT: 37 U/L (ref 0–44)
AST: 33 U/L (ref 15–41)
Albumin: 3.7 g/dL (ref 3.5–5.0)
Alkaline Phosphatase: 51 U/L (ref 38–126)
Bilirubin, Direct: 0.2 mg/dL (ref 0.0–0.2)
Indirect Bilirubin: 0.7 mg/dL (ref 0.3–0.9)
Total Bilirubin: 0.9 mg/dL (ref 0.3–1.2)
Total Protein: 7 g/dL (ref 6.5–8.1)

## 2019-10-01 LAB — LIPASE, BLOOD: Lipase: 30 U/L (ref 11–51)

## 2019-10-01 MED ORDER — AZITHROMYCIN 500 MG PO TABS
500.0000 mg | ORAL_TABLET | Freq: Once | ORAL | Status: AC
  Administered 2019-10-01: 500 mg via ORAL
  Filled 2019-10-01: qty 1

## 2019-10-01 MED ORDER — SODIUM CHLORIDE 0.9 % IV SOLN
1000.0000 mL | Freq: Once | INTRAVENOUS | Status: AC
  Administered 2019-10-01: 1000 mL via INTRAVENOUS

## 2019-10-01 MED ORDER — IOHEXOL 300 MG/ML  SOLN
100.0000 mL | Freq: Once | INTRAMUSCULAR | Status: AC | PRN
  Administered 2019-10-01: 100 mL via INTRAVENOUS
  Filled 2019-10-01: qty 100

## 2019-10-01 MED ORDER — ACETAMINOPHEN 325 MG PO TABS
650.0000 mg | ORAL_TABLET | Freq: Once | ORAL | Status: AC
  Administered 2019-10-01: 650 mg via ORAL
  Filled 2019-10-01: qty 2

## 2019-10-01 MED ORDER — AZITHROMYCIN 500 MG PO TABS: 500.0000 mg | ORAL_TABLET | Freq: Every day | ORAL | 0 refills | Status: AC

## 2019-10-01 MED ORDER — POTASSIUM CHLORIDE CRYS ER 20 MEQ PO TBCR
40.0000 meq | EXTENDED_RELEASE_TABLET | Freq: Once | ORAL | Status: AC
  Administered 2019-10-01: 40 meq via ORAL
  Filled 2019-10-01: qty 2

## 2019-10-01 NOTE — ED Triage Notes (Signed)
*  SPANISH INTERPRETER NEEDED*  Pt here for HA, emesis and diarrhea for the past 3 days. Light brown stool.

## 2019-10-01 NOTE — ED Notes (Signed)
See triage note  Presents with n/v and h/a    Recheck on b/p was 99/50  Charge nurse aware

## 2019-10-01 NOTE — Discharge Instructions (Addendum)
You tested positive for Campylobacter bacteria.  This is usually caught from eating undercooked chicken.  I have given you a prescription for antibiotics.  Your CT scan also showed some small pulmonary nodules and a bladder calcification.  These need to be evaluated further by primary care and possibly a specialist.  I have given you a referral for a urologist to evaluate the bladder calcification.  Please call your primary care doctor for a follow-up appointment this week.

## 2019-10-01 NOTE — ED Provider Notes (Signed)
[facility] Emergency Department Provider Note  ____________________________________________  Time seen: Approximately 12:52 PM  I have reviewed the triage vital signs and the nursing notes.   HISTORY  Chief Complaint Headache and Emesis    HPI Bryan Shah is a 69 y.o. male that presents to the emergency department for evaluation of headache, nausea, diarrhea for 3 days.  Patient states the diarrhea is watery.  Patient denies sick contacts.  Patient states that primary care increased his blood pressure medication 1 week ago.  Patient is unsure of the name but it was increased from 10 mg to 20 mg.  Patient denies any bloody diarrhea.  No sick contacts.  He does not smoke cigarettes and never has.  No fever, shortness of breath, chest pain, vomiting.  History reviewed. No pertinent past medical history.  There are no problems to display for this patient.   History reviewed. No pertinent surgical history.  Prior to Admission medications   Medication Sig Start Date End Date Taking? Authorizing Provider  gabapentin (NEURONTIN) 300 MG capsule Take 1 capsule (300 mg total) by mouth 3 (three) times daily. 12/22/15 12/21/16  Beers, Pierce Crane, PA-C    Allergies Patient has no known allergies.  History reviewed. No pertinent family history.  Social History Social History   Tobacco Use  . Smoking status: Current Every Day Smoker  . Smokeless tobacco: Never Used  Substance Use Topics  . Alcohol use: No  . Drug use: Not on file     Review of Systems  Constitutional: No fever/chills Cardiovascular: No chest pain. Respiratory: No cough. No SOB. Gastrointestinal: No abdominal pain.  No nausea, no vomiting.  Positive for diarrhea. Musculoskeletal: Negative for musculoskeletal pain. Skin: Negative for rash, abrasions, lacerations, ecchymosis. Neurological: Negative for numbness or tingling.  Positive for  headache.   ____________________________________________   PHYSICAL EXAM:  VITAL SIGNS: ED Triage Vitals  Enc Vitals Group     BP 10/01/19 1018 (!) 98/51     Pulse Rate 10/01/19 1016 86     Resp 10/01/19 1016 18     Temp 10/01/19 1016 98.7 F (37.1 C)     Temp Source 10/01/19 1016 Oral     SpO2 10/01/19 1016 98 %     Weight 10/01/19 1022 244 lb 14.9 oz (111.1 kg)     Height 10/01/19 1022 5\' 7"  (1.702 m)     Head Circumference --      Peak Flow --      Pain Score 10/01/19 1021 7     Pain Loc --      Pain Edu? --      Excl. in [place]? --      Constitutional: Alert and oriented. Well appearing and in no acute distress. Eyes: Conjunctivae are normal. PERRL. EOMI. Head: Atraumatic. ENT:      Ears:      Nose: No congestion/rhinnorhea.      Mouth/Throat: Mucous membranes are moist.  Neck: No stridor.  Cardiovascular: Normal rate, regular rhythm.  Good peripheral circulation. Respiratory: Normal respiratory effort without tachypnea or retractions. Lungs CTAB. Good air entry to the bases with no decreased or absent breath sounds. Gastrointestinal: Bowel sounds 4 quadrants. Soft and nontender to palpation. No guarding or rigidity. No palpable masses. No distention.  Musculoskeletal: Full range of motion to all extremities. No gross deformities appreciated. Neurologic:  Normal speech and language. No gross focal neurologic deficits are appreciated.  Skin:  Skin is warm, dry and intact. No rash  noted. Psychiatric: Mood and affect are normal. Speech and behavior are normal. Patient exhibits appropriate insight and judgement.   ____________________________________________   LABS (all labs ordered are listed, but only abnormal results are displayed)  Labs Reviewed  CBC - Abnormal; Notable for the following components:      Result Value   WBC 13.4 (*)    All other components within normal limits  BASIC METABOLIC PANEL - Abnormal; Notable for the following components:   Potassium  3.3 (*)    Glucose, Bld 146 (*)    BUN 28 (*)    Creatinine, Ser 1.35 (*)    Calcium 8.5 (*)    GFR calc non Af Amer 54 (*)    All other components within normal limits  C DIFFICILE QUICK SCREEN W PCR REFLEX  GASTROINTESTINAL PANEL BY PCR, STOOL (REPLACES STOOL CULTURE)  HEPATIC FUNCTION PANEL  LIPASE, BLOOD  URINALYSIS, COMPLETE (UACMP) WITH MICROSCOPIC   ____________________________________________  EKG   ____________________________________________  RADIOLOGY Lexine Baton, personally viewed and evaluated these images (plain radiographs) as part of my medical decision making, as well as reviewing the written report by the radiologist.  CT Head Wo Contrast  Result Date: 10/01/2019 CLINICAL DATA:  Headache and emesis EXAM: CT HEAD WITHOUT CONTRAST TECHNIQUE: Contiguous axial images were obtained from the base of the skull through the vertex without intravenous contrast. COMPARISON:  None. FINDINGS: Brain: Ventricles and sulci are normal in size and configuration. There is no intracranial mass, hemorrhage, extra-axial fluid collection, or midline shift. Brain parenchyma appears unremarkable. No evident acute infarct. Vascular: No hyperdense vessel.  No evident vascular calcification. Skull: Bony calvarium appears intact. Sinuses/Orbits: Mucosal thickening is noted in multiple ethmoid air cells. Other visualized paranasal sinuses are clear. Orbits appear symmetric bilaterally. Other: Mastoid air cells are clear. There is debris in each external auditory canal. IMPRESSION: Brain parenchyma appears unremarkable.  No mass or hemorrhage. There are foci of ethmoid paranasal sinus disease. There is probable cerumen in each external auditory canal. Electronically Signed   By: Bretta Bang III M.D.   On: 10/01/2019 13:04   CT ABDOMEN PELVIS W CONTRAST  Result Date: 10/01/2019 CLINICAL DATA:  Acute abdominal pain.  Vomiting and diarrhea. EXAM: CT ABDOMEN AND PELVIS WITH CONTRAST  TECHNIQUE: Multidetector CT imaging of the abdomen and pelvis was performed using the standard protocol following bolus administration of intravenous contrast. CONTRAST:  OMNIPAQUE IOHEXOL 300 MG/ML  SOLN COMPARISON:  None. FINDINGS: Lower chest: There multiple tiny bilateral pulmonary nodules lung bases, the largest being 5 mm on image 11 of series 4. Otherwise negative. Hepatobiliary: No focal liver abnormality is seen. No gallstones, gallbladder wall thickening, or biliary dilatation. Pancreas: Unremarkable. No pancreatic ductal dilatation or surrounding inflammatory changes. Spleen: Normal in size without focal abnormality. Adrenals/Urinary Tract: Normal adrenal glands. 10 mm cyst on the lower pole left kidney. Kidneys are otherwise normal. No hydronephrosis. Nondependent 3 mm calcification in the anterior aspect of the bladder with slight thickening of the adjacent bladder wall. Stomach/Bowel: Stomach is within normal limits. Appendix appears normal. There is a single loop of minimally distended small bowel in the mid abdomen. This is nonspecific. There are few small lymph nodes in the mesentery. Bowel is otherwise normal including the terminal ileum appendix. Vascular/Lymphatic: Slight aortic atherosclerosis. No enlarged abdominal or pelvic lymph nodes. Reproductive: Prostate is unremarkable. Other: No abdominal wall hernia or abnormality. No abdominopelvic ascites. Musculoskeletal: There appears to be fairly severe spinal stenosis at L2-3 and at L4-5.  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 No acute bony abnormalities. IMPRESSION: 1. Single loop of minimally distended small bowel in the mid abdomen, nonspecific. 2. Multiple tiny bilateral pulmonary nodules, the largest being 5 mm. No follow-up needed if patient is low-risk (and has no known or suspected primary neoplasm). Non-contrast chest CT can be considered in 12 months if patient is high-risk. This recommendation follows the consensus statement:  Guidelines for Management of Incidental Pulmonary Nodules Detected on CT Images: From the Fleischner Society 2017; Radiology 2017; 284:228-243. 3. Nondependent 3 mm calcification in the anterior aspect of the bladder with slight thickening of the adjacent bladder wall. This is likely in the bladder wall. The possibility of a bladder wall mass should be considered. 4. Fairly severe spinal stenosis at L2-3 and L4-5. 5. Aortic atherosclerosis. Aortic Atherosclerosis (ICD10-I70.0). Electronically Signed   By: Francene Boyers M.D.   On: 10/01/2019 15:09    ____________________________________________    PROCEDURES  Procedure(s) performed:    Procedures    Medications  0.9 %  sodium chloride infusion (0 mLs Intravenous Stopped 10/01/19 1607)  acetaminophen (TYLENOL) tablet 650 mg (650 mg Oral Given 10/01/19 1503)  potassium chloride SA (KLOR-CON) CR tablet 40 mEq (40 mEq Oral Given 10/01/19 1502)  iohexol (OMNIPAQUE) 300 MG/ML solution 100 mL (100 mLs Intravenous Contrast Given 10/01/19 1431)     ____________________________________________   INITIAL IMPRESSION / ASSESSMENT AND PLAN / ED COURSE  Pertinent labs & imaging results that were available during my care of the patient were reviewed by me and considered in my medical decision making (see chart for details).  Review of the [place] CSRS was performed in accordance of the [facility] prior to dispensing any controlled drugs.  Patient's diagnosis is consistent with gastroenteritis.  Vital signs and exam are reassuring.  Patient was mildly hypotensive when he arrived in the emergency department.  He states that primary care just increased his blood pressure medication within the last week from 10 mg to 20 mg.  He is unsure of the of the name of the blood pressure medication.  Patient was given fluids in the emergency department.  Blood pressure at recheck was 112/62.  Patient has a mild leukocytosis of 13.4.  CT scan shows a minimal single loop of  distended small bowel in the midabdomen without any additional acute abnormalities.  CT findings of pulmonary nodule and bladder calcification were discussed with the patient and he will follow up with primary care for further evaluation.  BUN mildly elevated at 28, creatinine 1.35 and GFR 54.  Patient is likely dehydrated from diarrhea and was given fluids.  C. difficile is negative.  Stool PCR positive for Campylobacter.  Patient was given a dose of azithromycin in the emergency department.  He will continue to drink Pedialyte at home.  Patient is hungry in the emergency department and have his daughter bring him food.  He has also had a couple of Sprite.  Patient overall feels well and is ready to go home.  Patient will be discharged home with prescriptions for azithromycin.  Patient is to follow up with primary care and urology as directed.  Patient will call primary care in the morning for a follow-up appointment this week.  Patient is given ED precautions to return to the ED for any worsening or new symptoms.   Bryan Shah was evaluated in Emergency Department on 10/01/2019 for the symptoms described in the history of present illness. He was evaluated in the context of the  global COVID-19 pandemic, which necessitated consideration that the patient might be at risk for infection with the SARS-CoV-2 virus that causes COVID-19. Institutional protocols and algorithms that pertain to the evaluation of patients at risk for COVID-19 are in a state of rapid change based on information released by regulatory bodies including the CDC and federal and state organizations. These policies and algorithms were followed during the patient's care in the ED.  ____________________________________________  FINAL CLINICAL IMPRESSION(S) / ED DIAGNOSES  Final diagnoses:  Gastroenteritis  Pulmonary nodule  Bladder mass      NEW MEDICATIONS STARTED DURING THIS VISIT:  ED Discharge Orders    None           This chart was dictated using voice recognition software/Dragon. Despite best efforts to proofread, errors can occur which can change the meaning. Any change was purely unintentional.    Enid Derry, PA-C 10/01/19 1900    Jene Every, MD 10/06/19 [phone]

## 2020-02-06 ENCOUNTER — Encounter: Payer: Self-pay | Admitting: *Deleted

## 2021-09-02 DIAGNOSIS — H11061 Recurrent pterygium of right eye: Secondary | ICD-10-CM | POA: Insufficient documentation

## 2021-12-19 ENCOUNTER — Emergency Department
Admission: EM | Admit: 2021-12-19 | Discharge: 2021-12-19 | Disposition: A | Discharge status: Home / Self Care | Payer: Medicare HMO | Attending: Emergency Medicine | Admitting: Emergency Medicine

## 2021-12-19 ENCOUNTER — Other Ambulatory Visit: Payer: Self-pay

## 2021-12-19 DIAGNOSIS — Z7984 Long term (current) use of oral hypoglycemic drugs: Secondary | ICD-10-CM | POA: Diagnosis not present

## 2021-12-19 DIAGNOSIS — E119 Type 2 diabetes mellitus without complications: Secondary | ICD-10-CM | POA: Diagnosis not present

## 2021-12-19 DIAGNOSIS — B369 Superficial mycosis, unspecified: Secondary | ICD-10-CM | POA: Insufficient documentation

## 2021-12-19 DIAGNOSIS — R21 Rash and other nonspecific skin eruption: Secondary | ICD-10-CM | POA: Diagnosis present

## 2021-12-19 LAB — CBG MONITORING, ED: Glucose-Capillary: 110 mg/dL — ABNORMAL HIGH (ref 70–99)

## 2021-12-19 MED ORDER — NYSTATIN 100000 UNIT/GM EX CREA: 1.0000 | TOPICAL_CREAM | Freq: Two times a day (BID) | CUTANEOUS | 0 refills | Status: AC

## 2021-12-19 NOTE — ED Provider Notes (Signed)
[facility] Provider Note    None    (approximate)   History   Skin Problem   HPI Spanish interpreter [place] present. Bryan Shah is a 71 y.o. male   presents to the ED with complaint of a rash on his abdomen.  Wife states that he has been there off and on for approximately 1 year.  Patient states that the area itches, becomes painful when he scratches and also occasionally has an odor.  Patient has a history of diabetes and is taking oral medication but does not know the name of the medication.  He states his blood sugars at his doctor's office are normal.      Physical Exam   Triage Vital Signs: ED Triage Vitals  Enc Vitals Group     BP 12/19/21 0516 (!) 166/66     Pulse Rate 12/19/21 0516 61     Resp 12/19/21 0516 16     Temp 12/19/21 0516 97.8 F (36.6 C)     Temp Source 12/19/21 0516 Oral     SpO2 12/19/21 0516 100 %     Weight 12/19/21 0522 260 lb (117.9 kg)     Height 12/19/21 0522 5\' 7"  (1.702 m)     Head Circumference --      Peak Flow --      Pain Score 12/19/21 0519 7     Pain Loc --      Pain Edu? --      Excl. in [place]? --     Most recent vital signs: Vitals:   12/19/21 0516  BP: (!) 166/66  Pulse: 61  Resp: 16  Temp: 97.8 F (36.6 C)  SpO2: 100%     General: Awake, no distress.  CV:  Good peripheral perfusion.  Resp:  Normal effort.  Abd:  No distention.  Pannus area with skin irritation and redness.  Wet appearance.  No active drainage. Other:     ED Results / Procedures / Treatments   Labs (all labs ordered are listed, but only abnormal results are displayed) Labs Reviewed  CBG MONITORING, ED - Abnormal; Notable for the following components:      Result Value   Glucose-Capillary 110 (*)    All other components within normal limits       PROCEDURES:  Critical Care performed:   Procedures   MEDICATIONS ORDERED IN ED: Medications - No data to display   IMPRESSION / MDM / ASSESSMENT  AND PLAN / ED COURSE  I reviewed the triage vital signs and the nursing notes.   Differential diagnosis includes, but is not limited to, fungus infection, rash, abrasion.  71 year old male with history of intermittent rash to the underside of his abdomen off and on for 1 year.  Patient currently is taking oral diabetic medication with name unknown.  Fasting blood sugar in the ED was 110.  With the help of the Spanish interpreter it was explained to him that the better he can keep control of his diabetes the last he will have with this problem.  He is also to make sure that this area gets completely dry after showers and if he is sweating a lot.  A prescription for nystatin cream was sent to the pharmacy with instructions to use it twice a day.  He is to follow-up with his PCP if any continued problems.      Patient's presentation is most consistent with acute complicated illness / injury requiring diagnostic workup.  FINAL CLINICAL IMPRESSION(S) / ED DIAGNOSES   Final diagnoses:  Fungal infection of skin of abdomen     Rx / DC Orders   ED Discharge Orders          Ordered    nystatin cream (MYCOSTATIN)  2 times daily        12/19/21 0816             Note:  This document was prepared using Dragon voice recognition software and may include unintentional dictation errors.   Tommi Rumps, PA-C 12/19/21 1307    Concha Se, MD 12/19/21 1321

## 2021-12-19 NOTE — ED Triage Notes (Signed)
Reports abrasion to underside of lower abd in subcu tissue. Reports it is from where his seat belt and pants rub against his skin. Abrasion noted in triage without drainage or bleeding.

## 2021-12-19 NOTE — Discharge Instructions (Signed)
Follow up with [facility] if any continued problems  Use cream as directed

## 2022-02-02 IMAGING — CT CT HEAD W/O CM
3 series · 15 of 47 positions shown, 18 images · non-contrast
Comparison: None.

CLINICAL DATA: Headache and emesis

EXAM:
CT HEAD WITHOUT CONTRAST
TECHNIQUE: Contiguous axial images were obtained from the base of the skull
through the vertex without intravenous contrast.

[Series 2: head wo · axial · 0.47mm/px · z∈[-169,-39]mm · 9 of 32 slices shown, 12 images]
[im 3/32  brain]
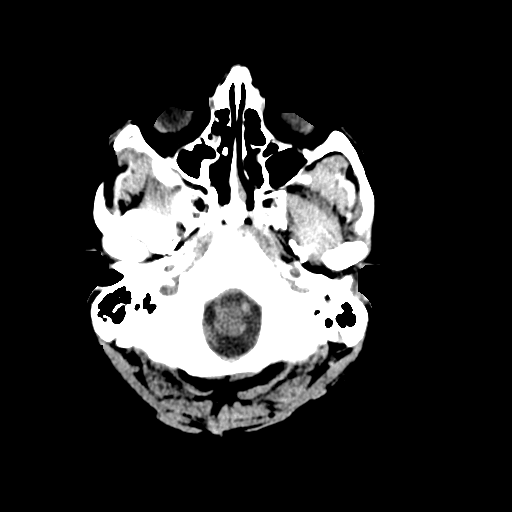
[im 3/32  bone]
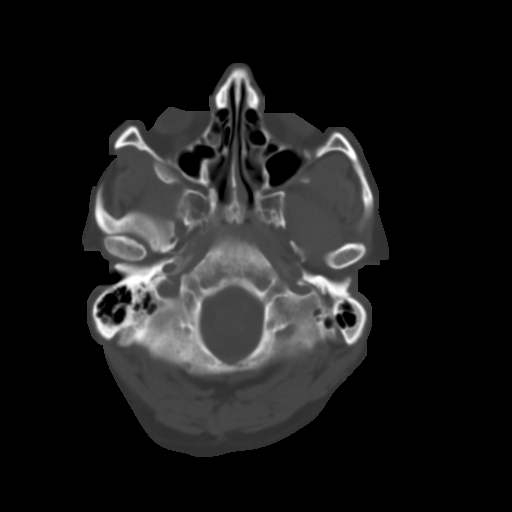
[im 6/32  brain]
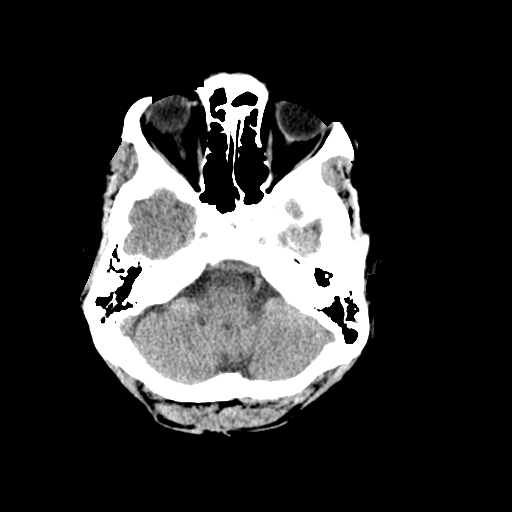
[im 9/32  brain]
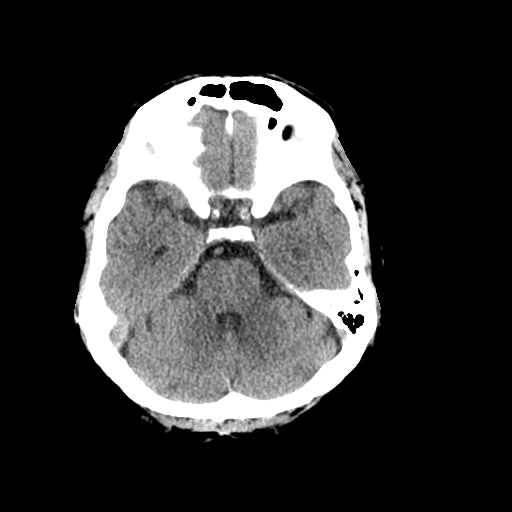
[im 12/32  brain]
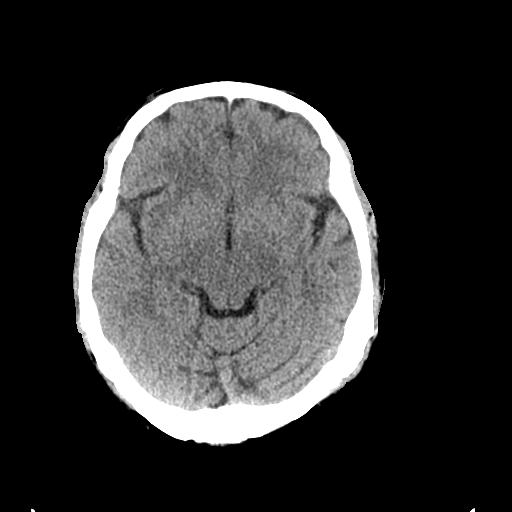
[im 17/32  brain]
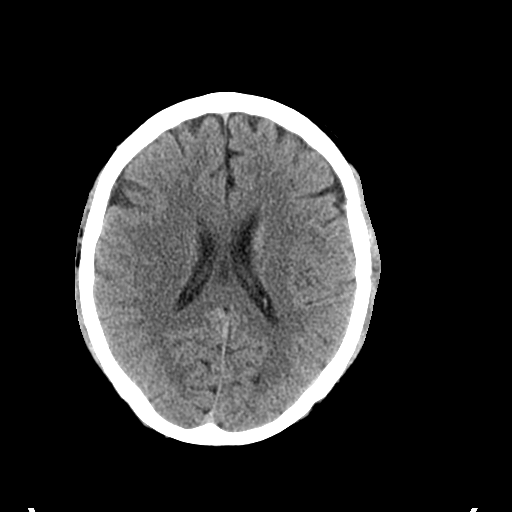
[im 17/32  bone]
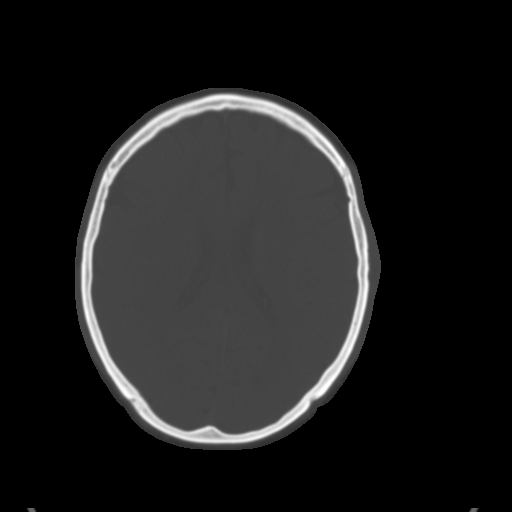
[im 20/32  brain]
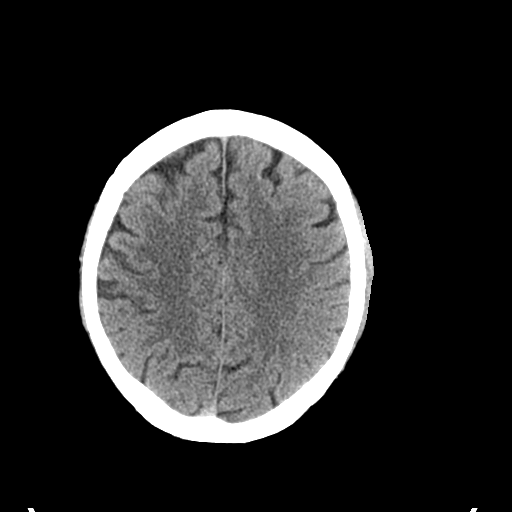
[im 23/32  brain]
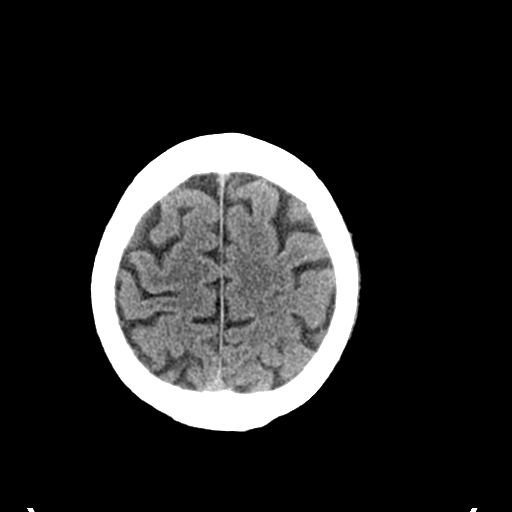
[im 26/32  brain]
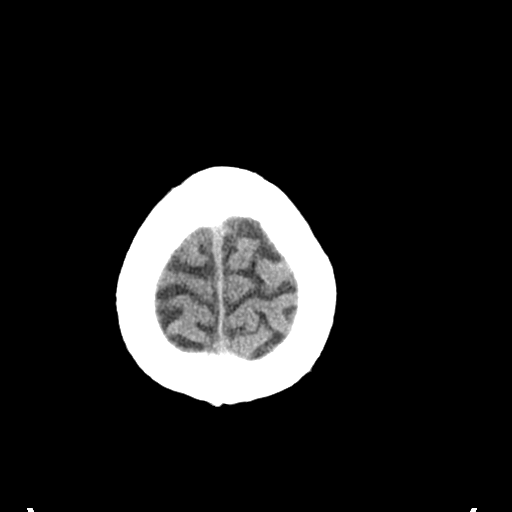
[im 29/32  brain]
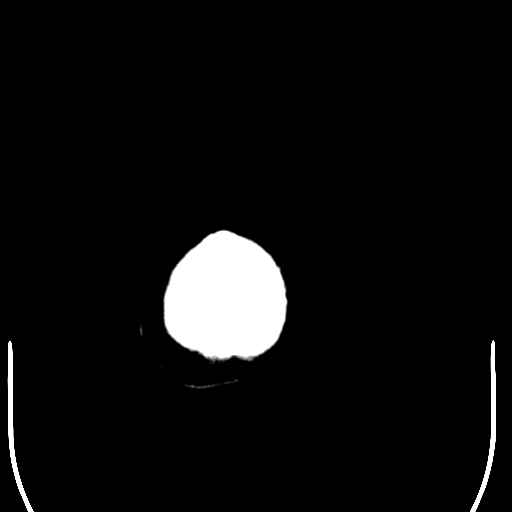
[im 29/32  bone]
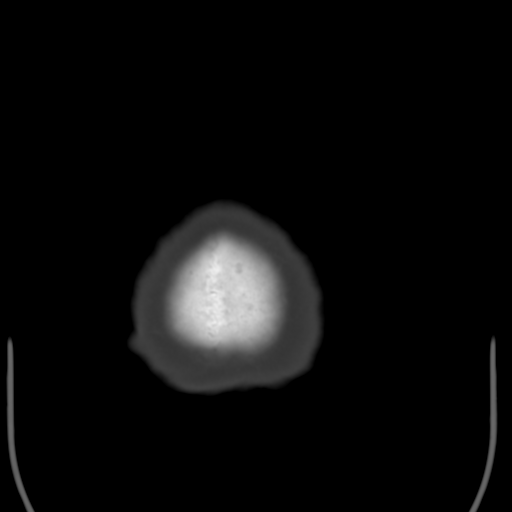

[Series 4: coronal soft tissue · coronal · 0.31mm/px · 3 of 65 slices shown]
[im 22/65  brain]
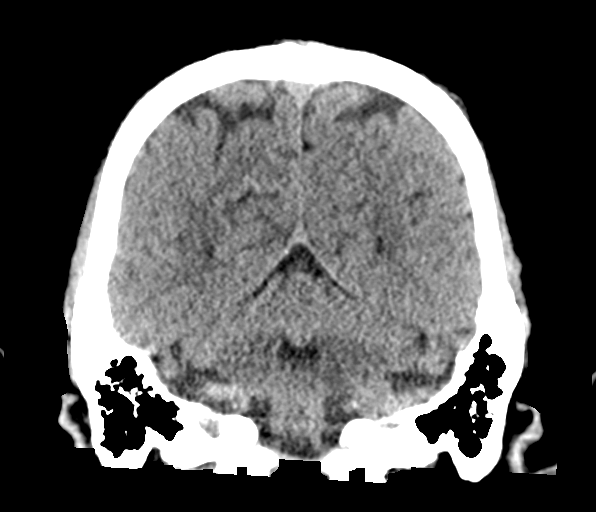
[im 29/65  brain]
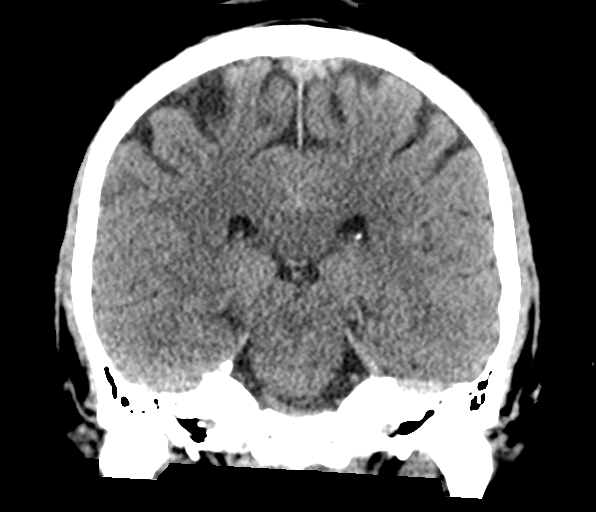
[im 36/65  brain]
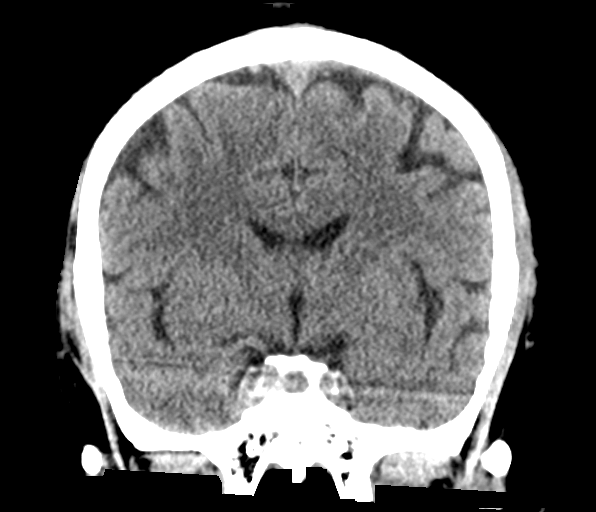

[Series 5: sagittal soft tissue · sagittal · 0.31mm/px · 3 of 62 slices shown]
[im 21/62  brain]
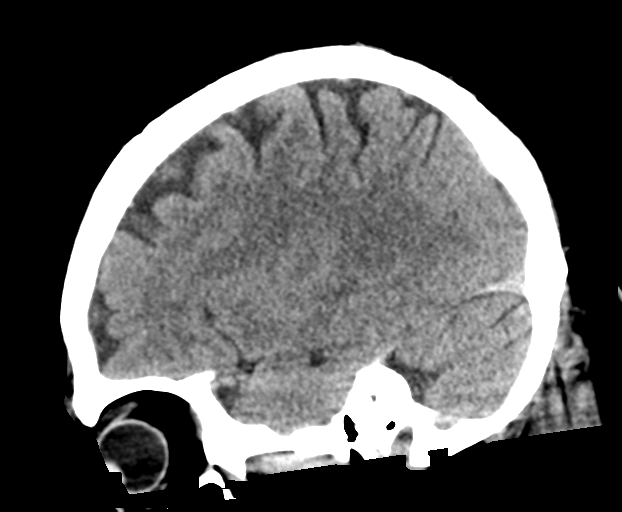
[im 31/62  brain]
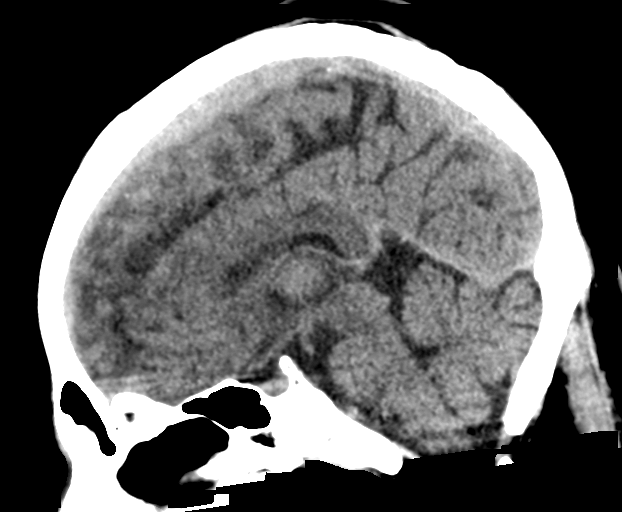
[im 41/62  brain]
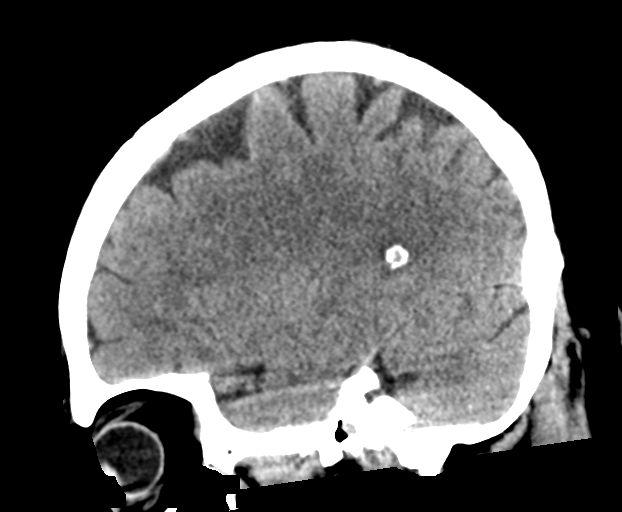

[15 of 47 positions shown; findings below may reference images not displayed]

FINDINGS: Brain: Ventricles and sulci are normal in size and configuration.
There is no intracranial mass, hemorrhage, extra-axial fluid
collection, or midline shift. Brain parenchyma appears unremarkable.
No evident acute infarct.

Vascular: No hyperdense vessel.  No evident vascular calcification.

Skull: Bony calvarium appears intact.

Sinuses/Orbits: Mucosal thickening is noted in multiple ethmoid air
cells. Other visualized paranasal sinuses are clear. Orbits appear
symmetric bilaterally.

Other: Mastoid air cells are clear. There is debris in each external
auditory canal.
IMPRESSION: Brain parenchyma appears unremarkable.  No mass or hemorrhage.

There are foci of ethmoid paranasal sinus disease. There is probable
cerumen in each external auditory canal.

## 2022-08-18 ENCOUNTER — Other Ambulatory Visit: Payer: Self-pay

## 2022-08-18 DIAGNOSIS — Z87828 Personal history of other (healed) physical injury and trauma: Secondary | ICD-10-CM

## 2022-08-19 ENCOUNTER — Other Ambulatory Visit: Payer: Self-pay | Admitting: Family Medicine

## 2022-08-19 DIAGNOSIS — Z87828 Personal history of other (healed) physical injury and trauma: Secondary | ICD-10-CM

## 2022-08-20 ENCOUNTER — Ambulatory Visit
Admission: RE | Admit: 2022-08-20 | Discharge: 2022-08-20 | Disposition: A | Discharge status: Home / Self Care | Payer: Medicare PPO | Source: Ambulatory Visit | Attending: Family Medicine | Admitting: Family Medicine

## 2022-08-20 DIAGNOSIS — Z87828 Personal history of other (healed) physical injury and trauma: Secondary | ICD-10-CM

## 2023-08-31 ENCOUNTER — Ambulatory Visit: Admitting: Urology

## 2023-08-31 ENCOUNTER — Encounter: Payer: Self-pay | Admitting: Urology

## 2023-08-31 VITALS — BP 111/65 | HR 97 | Ht 68.0 in | Wt 260.0 lb

## 2023-08-31 DIAGNOSIS — N401 Enlarged prostate with lower urinary tract symptoms: Secondary | ICD-10-CM

## 2023-08-31 DIAGNOSIS — R3911 Hesitancy of micturition: Secondary | ICD-10-CM

## 2023-08-31 DIAGNOSIS — R399 Unspecified symptoms and signs involving the genitourinary system: Secondary | ICD-10-CM

## 2023-08-31 DIAGNOSIS — N390 Urinary tract infection, site not specified: Secondary | ICD-10-CM | POA: Diagnosis not present

## 2023-08-31 LAB — MICROSCOPIC EXAMINATION

## 2023-08-31 LAB — URINALYSIS, COMPLETE
Bilirubin, UA: NEGATIVE
Glucose, UA: NEGATIVE
Nitrite, UA: NEGATIVE
Protein,UA: NEGATIVE
RBC, UA: NEGATIVE
Specific Gravity, UA: 1.025 (ref 1.005–1.030)
Urobilinogen, Ur: 0.2 mg/dL (ref 0.2–1.0)
pH, UA: 6 (ref 5.0–7.5)

## 2023-08-31 LAB — BLADDER SCAN AMB NON-IMAGING: Scan Result: 33

## 2023-08-31 MED ORDER — TAMSULOSIN HCL 0.4 MG PO CAPS: 0.8000 mg | ORAL_CAPSULE | Freq: Every day | ORAL | 1 refills | Status: DC

## 2023-08-31 NOTE — Progress Notes (Signed)
    I, Maysun Jamey Mccallum, acting as a Neurosurgeon for Geraline Knapp, MD., have documented all relevant documentation on the behalf of Geraline Knapp, MD, as directed by Geraline Knapp, MD while in the presence of Geraline Knapp, MD.  Discussed the use of AI scribe software for clinical note transcription with the patient, who gave verbal consent to proceed.   08/31/2023 4:40 PM   Bryan Shah April 06, 1951 [id number]  Referring provider: Patrina Boos, MD [address] [place],  [place] [address]  Chief Complaint  Patient presents with   Recurrent UTI    HPI: Bryan Shah is a 73 y.o. male referred for evaluation of recurrent UTIs. A Spanish interpreter was present via video link.  Seeing [facility] March 2025 complaining of dysuria. Dipstick urinalysis was negative and urine culture subsequently grew mixed flora.  The only problem patient desired to discuss today was urinary frequency, hesitancy and a weak urinary stream. He denies dysuria. He is on Tamsulosin  0.4 mg daily.  Denies gross hematuria. His PSA March 2025 was 0.7   Home Medications:  Allergies as of 08/31/2023   No Known Allergies      Medication List        Accurate as of Aug 31, 2023  4:40 PM. If you have any questions, ask your nurse or doctor.          Aspirin Low Dose 81 MG tablet Generic drug: aspirin EC Take 81 mg by mouth daily.   gabapentin  300 MG capsule Commonly known as: Neurontin  Take 1 capsule (300 mg total) by mouth 3 (three) times daily.   meloxicam 15 MG tablet Commonly known as: MOBIC Take 15 mg by mouth daily.   metFORMIN 500 MG tablet Commonly known as: GLUCOPHAGE Take 500 mg by mouth 2 (two) times daily.   nystatin  cream Commonly known as: MYCOSTATIN  Apply 1 Application topically 2 (two) times daily.   tamsulosin  0.4 MG Caps capsule Commonly known as: FLOMAX  Take 2 capsules (0.8 mg total) by mouth daily. Started by: Geraline Knapp        Allergies: No Known Allergies  Social History:  reports that he has been smoking. He has never used smokeless tobacco. He reports that he does not drink alcohol. No history on file for drug use.   Physical Exam: BP 111/65   Pulse 97   Ht 5\' 8"  (1.727 m)   Wt 260 lb (117.9 kg)   BMI 39.53 kg/m   Constitutional:  Alert and oriented, No acute distress. HEENT: [place] AT Respiratory: Normal respiratory effort, no increased work of breathing. GU: Prostate 40 grams, smooth without nodules.  Psychiatric: Normal mood and affect.  Urinalysis Dipstick trace ketone/trace leukocytes, microscopy 6-10 WBC.   Assessment & Plan:    1. Lower urinary tract symptoms We discussed the most common cause of lower urinary tract symptoms is BPH; moderate enlargement on DRE today.  PVR 33 mL Increase Tamsulosin  to 0.8 mg daily.  6 week follow-up for recheck.  I have reviewed the above documentation for accuracy and completeness, and I agree with the above.   Geraline Knapp, MD  [facility] [address] [place], [place] [address] [phone]

## 2023-09-06 LAB — CULTURE, URINE COMPREHENSIVE

## 2023-09-07 ENCOUNTER — Other Ambulatory Visit: Payer: Self-pay | Admitting: *Deleted

## 2023-09-07 ENCOUNTER — Ambulatory Visit: Payer: Self-pay | Admitting: Urology

## 2023-09-07 DIAGNOSIS — N401 Enlarged prostate with lower urinary tract symptoms: Secondary | ICD-10-CM

## 2023-09-07 DIAGNOSIS — N1339 Other hydronephrosis: Secondary | ICD-10-CM

## 2023-09-07 MED ORDER — AMOXICILLIN 875 MG PO TABS: 875.0000 mg | ORAL_TABLET | Freq: Two times a day (BID) | ORAL | 0 refills | Status: AC

## 2023-09-07 NOTE — Addendum Note (Signed)
 Addended by: Westley Hammers on: 09/07/2023 07:37 AM   Modules accepted: Orders

## 2023-10-13 ENCOUNTER — Ambulatory Visit: Admitting: Urology

## 2023-10-14 ENCOUNTER — Telehealth: Payer: Self-pay

## 2023-10-14 DIAGNOSIS — Z1211 Encounter for screening for malignant neoplasm of colon: Secondary | ICD-10-CM

## 2023-10-14 NOTE — Telephone Encounter (Signed)
 Call made with the assistance of [facility] [employer]pre[employer] ID [id number] [facility].  Pt request call back because he was driving.  We will need to review his medications and get cardiac clearance from Dr. [place] Lee after scheduling.  Call patient back on Monday around 9:30am-10:30am.  Thanks, Rosaline, CMA

## 2023-10-17 NOTE — Telephone Encounter (Signed)
 Unable to schedule colonoscopy because patient cannot confirm medications due to he is unable to read pill bottles. Asked if there was an adult who could help him read his pill bottles because its important for him to know which medications will need to be stopped and when to stop prior to scheduling his procedure.  He said his daughter is in [place] and he is alone.  Pt has been advised that I am unable to move forward with scheduling because medication reconciliation cannot be completed without him being able to confirm his current medications, and he doesn't have anyone to drive him to his procedure.   Thanks,  [place], CMA

## 2023-10-27 ENCOUNTER — Other Ambulatory Visit: Payer: Self-pay

## 2023-10-27 DIAGNOSIS — Z1211 Encounter for screening for malignant neoplasm of colon: Secondary | ICD-10-CM

## 2023-10-27 NOTE — Addendum Note (Signed)
 Addended by: JODIE HEADINGS on: 10/27/2023 01:47 PM   Modules accepted: Orders

## 2023-10-27 NOTE — Telephone Encounter (Signed)
 Gastroenterology Pre-Procedure Review  Request Date: 11/01/23 Requesting Physician: Dr. Jinny  PATIENT REVIEW QUESTIONS: The patient responded to the following health history questions as indicated:    1. Are you having any GI issues? no 2. Do you have a personal history of Polyps? no 3. Do you have a family history of Colon Cancer or Polyps? no 4. Diabetes Mellitus? Prediabetic advised to stop metformin 2 days prior 5. Joint replacements in the past 12 months?no 6. Major health problems in the past 3 months?no 7. Any artificial heart valves, MVP, or defibrillator?no    MEDICATIONS & ALLERGIES:    Patient reports the following regarding taking any anticoagulation/antiplatelet therapy:   Plavix, Coumadin, Eliquis, Xarelto, Lovenox, Pradaxa, Brilinta, or Effient? no Aspirin? no  Patient confirms/reports the following medications:  Current Outpatient Medications  Medication Sig Dispense Refill   losartan (COZAAR) 50 MG tablet Take 50 mg by mouth daily.     tadalafil (CIALIS) 20 MG tablet Take 20 mg by mouth daily as needed for erectile dysfunction (take 30-45 minutes prior to need).     ASPIRIN LOW DOSE 81 MG tablet Take 81 mg by mouth daily.     atorvastatin (LIPITOR) 40 MG tablet Take 40 mg by mouth daily.     baclofen (LIORESAL) 10 MG tablet Take 5 mg by mouth 3 (three) times daily.     DULoxetine (CYMBALTA) 60 MG capsule Take 60 mg by mouth daily.     meloxicam (MOBIC) 15 MG tablet Take 15 mg by mouth daily.     metFORMIN (GLUCOPHAGE) 500 MG tablet Take 500 mg by mouth 2 (two) times daily.     nystatin  cream (MYCOSTATIN ) Apply 1 Application topically 2 (two) times daily. 60 g 0   pregabalin (LYRICA) 75 MG capsule Take 75 mg by mouth 2 (two) times daily.     tamsulosin  (FLOMAX ) 0.4 MG CAPS capsule Take 2 capsules (0.8 mg total) by mouth daily. 60 capsule 1   No current facility-administered medications for this visit.    Patient confirms/reports the following allergies:  No  Known Allergies  No orders of the defined types were placed in this encounter.   AUTHORIZATION INFORMATION Primary Insurance: 1D#: Group #:  Secondary Insurance: 1D#: Group #:  SCHEDULE INFORMATION: Date: 11/01/23 Time: Location: [facility]

## 2023-10-27 NOTE — Telephone Encounter (Signed)
 I have called patient twice and I have not been able to speak with him nor leave him a voicemail since it's not set-up. I reached out to his PCP's office and I was able to speak to the nurse and asked if she could go over his medications. We went over his medications and they are the correct ones in his medical record sent from them. I will go ahead and put them in his chart.  Now I do not know how we could schedule his colonoscopy if he doesn't answer my calls.

## 2023-10-27 NOTE — Telephone Encounter (Signed)
 Patient came in the office and was able to go over his instructions for his upcoming colonoscopy. Patient agreed on having it done on 11/01/2023. Rosaline was informaed and she was placing the order.

## 2023-10-31 ENCOUNTER — Encounter: Payer: Self-pay | Admitting: Gastroenterology

## 2023-11-01 ENCOUNTER — Ambulatory Visit: Admitting: Anesthesiology

## 2023-11-01 ENCOUNTER — Other Ambulatory Visit: Payer: Self-pay

## 2023-11-01 ENCOUNTER — Encounter: Payer: Self-pay | Admitting: Gastroenterology

## 2023-11-01 ENCOUNTER — Ambulatory Visit
Admission: RE | Admit: 2023-11-01 | Discharge: 2023-11-01 | Disposition: A | Discharge status: Home / Self Care | Attending: Gastroenterology | Admitting: Gastroenterology

## 2023-11-01 ENCOUNTER — Encounter
Admission: RE | Disposition: A | Discharge status: Home / Self Care | Payer: Self-pay | Source: Home / Self Care | Attending: Gastroenterology

## 2023-11-01 DIAGNOSIS — K641 Second degree hemorrhoids: Secondary | ICD-10-CM | POA: Diagnosis not present

## 2023-11-01 DIAGNOSIS — I10 Essential (primary) hypertension: Secondary | ICD-10-CM | POA: Insufficient documentation

## 2023-11-01 DIAGNOSIS — Z7984 Long term (current) use of oral hypoglycemic drugs: Secondary | ICD-10-CM | POA: Insufficient documentation

## 2023-11-01 DIAGNOSIS — K5730 Diverticulosis of large intestine without perforation or abscess without bleeding: Secondary | ICD-10-CM | POA: Insufficient documentation

## 2023-11-01 DIAGNOSIS — E119 Type 2 diabetes mellitus without complications: Secondary | ICD-10-CM | POA: Insufficient documentation

## 2023-11-01 DIAGNOSIS — Z1211 Encounter for screening for malignant neoplasm of colon: Secondary | ICD-10-CM | POA: Diagnosis present

## 2023-11-01 HISTORY — DX: Type 2 diabetes mellitus without complications: E11.9

## 2023-11-01 HISTORY — DX: Cardiac arrhythmia, unspecified: I49.9

## 2023-11-01 HISTORY — PX: COLONOSCOPY: SHX5424

## 2023-11-01 HISTORY — DX: Essential (primary) hypertension: I10

## 2023-11-01 SURGERY — COLONOSCOPY
Anesthesia: General

## 2023-11-01 MED ORDER — LIDOCAINE HCL (PF) 2 % IJ SOLN
INTRAMUSCULAR | Status: DC | PRN
Start: 2023-11-01 — End: 2023-11-01
  Administered 2023-11-01: 40 mg via INTRADERMAL

## 2023-11-01 MED ORDER — PROPOFOL 10 MG/ML IV BOLUS
INTRAVENOUS | Status: DC | PRN
  Administered 2023-11-01 (×2): 20 mg via INTRAVENOUS
  Administered 2023-11-01: 30 mg via INTRAVENOUS
  Administered 2023-11-01: 20 mg via INTRAVENOUS
  Administered 2023-11-01: 40 mg via INTRAVENOUS
  Administered 2023-11-01: 20 mg via INTRAVENOUS

## 2023-11-01 MED ORDER — SODIUM CHLORIDE 0.9 % IV SOLN: INTRAVENOUS | Status: DC

## 2023-11-01 NOTE — H&P (Signed)
 Rogelia Copping, MD [facility] [address] [place], [place] [address] Phone: [phone] Fax : [phone]  Primary Care Physician:  Adina Buel HERO, MD Primary Gastroenterologist:  Dr. Copping  Pre-Procedure History & Physical: HPI:  Bryan Shah is a 73 y.o. male is here for a screening colonoscopy.   Past Medical History:  Diagnosis Date   Diabetes mellitus without complication (HCC)    Dysrhythmia    Hypertension     Past Surgical History:  Procedure Laterality Date   NO PAST SURGERIES      Prior to Admission medications   Medication Sig Start Date End Date Taking? Authorizing Provider  ASPIRIN LOW DOSE 81 MG tablet Take 81 mg by mouth daily.    [provider]  atorvastatin (LIPITOR) 40 MG tablet Take 40 mg by mouth daily.    [provider]  baclofen (LIORESAL) 10 MG tablet Take 5 mg by mouth 3 (three) times daily.    [provider]  DULoxetine (CYMBALTA) 60 MG capsule Take 60 mg by mouth daily.    [provider]  losartan (COZAAR) 50 MG tablet Take 50 mg by mouth daily. 08/18/23   [provider]  meloxicam (MOBIC) 15 MG tablet Take 15 mg by mouth daily.    [provider]  metFORMIN (GLUCOPHAGE) 500 MG tablet Take 500 mg by mouth 2 (two) times daily.    [provider]  nystatin  cream (MYCOSTATIN ) Apply 1 Application topically 2 (two) times daily. 12/19/21   Saunders Shona CROME, PA-C  pregabalin (LYRICA) 75 MG capsule Take 75 mg by mouth 2 (two) times daily.    [provider]  tadalafil (CIALIS) 20 MG tablet Take 20 mg by mouth daily as needed for erectile dysfunction (take 30-45 minutes prior to need).    [provider]  tamsulosin  (FLOMAX ) 0.4 MG CAPS capsule Take 2 capsules (0.8 mg total) by mouth daily. 08/31/23   Twylla Glendia BROCKS, MD    Allergies as of 10/27/2023   (No Known Allergies)    History reviewed. No pertinent family history.  Social History    Socioeconomic History   Marital status: Married    Spouse name: Not on file   Number of children: Not on file   Years of education: Not on file   Highest education level: Not on file  Occupational History   Not on file  Tobacco Use   Smoking status: Never   Smokeless tobacco: Never  Vaping Use   Vaping status: Never Used  Substance and Sexual Activity   Alcohol use: No   Drug use: Never   Sexual activity: Not on file  Other Topics Concern   Not on file  Social History Narrative   Not on file   Social Drivers of Health   Financial Resource Strain: Not on file  Food Insecurity: Not on file  Transportation Needs: Not on file  Physical Activity: Not on file  Stress: Not on file  Social Connections: Not on file  Intimate Partner Violence: Not on file    Review of Systems: See HPI, otherwise negative ROS  Physical Exam: BP (!) 162/68   Pulse 60   Temp (!) 96.6 F (35.9 C) (Temporal)   Resp 16   Ht 5' 6 (1.676 m)   Wt 109.6 kg   SpO2 98%   BMI 39.00 kg/m  General:   Alert,  pleasant and cooperative in NAD Head:  Normocephalic and atraumatic. Neck:  Supple; no masses or  thyromegaly. Lungs:  Clear throughout to auscultation.    Heart:  Regular rate and rhythm. Abdomen:  Soft, nontender and nondistended. Normal bowel sounds, without guarding, and without rebound.   Neurologic:  Alert and  oriented x4;  grossly normal neurologically.  Impression/Plan: Bryan Shah is now here to undergo a screening colonoscopy.  Risks, benefits, and alternatives regarding colonoscopy have been reviewed with the patient.  Questions have been answered.  All parties agreeable.

## 2023-11-01 NOTE — Anesthesia Preprocedure Evaluation (Signed)
 Anesthesia Evaluation  Patient identified by MRN, date of birth, ID band Patient awake    Reviewed: Allergy & Precautions, NPO status , Patient's Chart, lab work & pertinent test results  History of Anesthesia Complications Negative for: history of anesthetic complications  Airway Mallampati: III  TM Distance: >3 FB Neck ROM: full    Dental no notable dental hx.    Pulmonary neg pulmonary ROS, Current Smoker   Pulmonary exam normal        Cardiovascular hypertension, Normal cardiovascular exam     Neuro/Psych negative neurological ROS  negative psych ROS   GI/Hepatic negative GI ROS, Neg liver ROS,,,  Endo/Other  diabetes, Type 2    Renal/GU negative Renal ROS  negative genitourinary   Musculoskeletal   Abdominal   Peds  Hematology negative hematology ROS (+)   Anesthesia Other Findings History reviewed. No pertinent past medical history.  History reviewed. No pertinent surgical history.     Reproductive/Obstetrics negative OB ROS                              Anesthesia Physical Anesthesia Plan  ASA: 3  Anesthesia Plan: General   Post-op Pain Management: Minimal or no pain anticipated   Induction: Intravenous  PONV Risk Score and Plan: 1 and Propofol  infusion and TIVA  Airway Management Planned: Natural Airway and Nasal Cannula  Additional Equipment:   Intra-op Plan:   Post-operative Plan:   Informed Consent: I have reviewed the patients History and Physical, chart, labs and discussed the procedure including the risks, benefits and alternatives for the proposed anesthesia with the patient or authorized representative who has indicated his/her understanding and acceptance.     Dental Advisory Given  Plan Discussed with: Anesthesiologist, CRNA and Surgeon  Anesthesia Plan Comments: (Patient consented for risks of anesthesia including but not limited to:  - adverse  reactions to medications - risk of airway placement if required - damage to eyes, teeth, lips or other oral mucosa - nerve damage due to positioning  - sore throat or hoarseness - Damage to heart, brain, nerves, lungs, other parts of body or loss of life  Patient voiced understanding and assent.)        Anesthesia Quick Evaluation

## 2023-11-01 NOTE — Transfer of Care (Signed)
 Immediate Anesthesia Transfer of Care Note  Patient: Bryan Shah  Procedure(s) Performed: COLONOSCOPY  Patient Location: PACU and Endoscopy Unit  Anesthesia Type:MAC  Level of Consciousness: drowsy  Airway & Oxygen Therapy: Patient Spontanous Breathing and Patient connected to nasal cannula oxygen  Post-op Assessment: Report given to RN and Post -op Vital signs reviewed and stable  Post vital signs: Reviewed and stable  Last Vitals:  Vitals Value Taken Time  BP 114/66   Temp    Pulse 60   Resp 16   SpO2 98     Last Pain:  Vitals:   11/01/23 1119  TempSrc: Temporal  PainSc: 0-No pain         Complications: No notable events documented.

## 2023-11-01 NOTE — Anesthesia Postprocedure Evaluation (Signed)
 Anesthesia Post Note  Patient: Filomeno Cromley  Procedure(s) Performed: COLONOSCOPY  Patient location during evaluation: Endoscopy Anesthesia Type: General Level of consciousness: awake and alert Pain management: pain level controlled Vital Signs Assessment: post-procedure vital signs reviewed and stable Respiratory status: spontaneous breathing, nonlabored ventilation, respiratory function stable and patient connected to nasal cannula oxygen Cardiovascular status: blood pressure returned to baseline and stable Postop Assessment: no apparent nausea or vomiting Anesthetic complications: no   No notable events documented.   Last Vitals:  Vitals:   11/01/23 1206 11/01/23 1212  BP: 114/66 (!) 117/59  Pulse: (!) 56 (!) 51  Resp: (!) 9 11  Temp: (!) 35.6 C   SpO2: 98% 100%    Last Pain:  Vitals:   11/01/23 1206  TempSrc: Tympanic  PainSc:                  Lendia LITTIE Mae

## 2023-11-01 NOTE — Op Note (Signed)
 [facility] Gastroenterology Patient Name: Bryan Shah Procedure Date: 11/01/2023 11:41 AM MRN: [id number] Account #: [id number] Date of Birth: 1951-04-11 Admit Type: Outpatient Age: 73 Room: [facility] ENDO ROOM 4 Gender: Male Note Status: Finalized Instrument Name: [id number] Procedure:             Colonoscopy Indications:           Screening for colorectal malignant neoplasm Providers:             Rogelia Copping MD, MD Referring MD:          Buel HERO. Adamo (Referring MD) Medicines:             Propofol  per Anesthesia Complications:         No immediate complications. Procedure:             Pre-Anesthesia Assessment:                        - Prior to the procedure, a History and Physical was                         performed, and patient medications and allergies were                         reviewed. The patient's tolerance of previous                         anesthesia was also reviewed. The risks and benefits                         of the procedure and the sedation options and risks                         were discussed with the patient. All questions were                         answered, and informed consent was obtained. Prior                         Anticoagulants: The patient has taken no anticoagulant                         or antiplatelet agents. ASA Grade Assessment: II - A                         patient with mild systemic disease. After reviewing                         the risks and benefits, the patient was deemed in                         satisfactory condition to undergo the procedure.                        After obtaining informed consent, the colonoscope was                         passed under direct vision. Throughout the procedure,  the patient's blood pressure, pulse, and oxygen                         saturations were monitored continuously. The                         Colonoscope was introduced  through the anus and                         advanced to the the cecum, identified by appendiceal                         orifice and ileocecal valve. The colonoscopy was                         performed without difficulty. The patient tolerated                         the procedure well. The quality of the bowel                         preparation was good. Findings:      The perianal and digital rectal examinations were normal.      A few small-mouthed diverticula were found in the sigmoid colon.      Non-bleeding internal hemorrhoids were found during retroflexion. The       hemorrhoids were Grade II (internal hemorrhoids that prolapse but reduce       spontaneously). Impression:            - Diverticulosis in the sigmoid colon.                        - Non-bleeding internal hemorrhoids.                        - No specimens collected. Recommendation:        - Discharge patient to home.                        - Resume previous diet.                        - Repeat colonoscopy in 10 years for screening                         purposes. Procedure Code(s):     --- Professional ---                        [phone], Colonoscopy, flexible; diagnostic, including                         collection of specimen(s) by brushing or washing, when                         performed (separate procedure) Diagnosis Code(s):     --- Professional ---                        Z12.11, Encounter for screening for malignant neoplasm  of colon CPT copyright 2022 [employer]. All rights reserved. The codes documented in this report are preliminary and upon coder review may  be revised to meet current compliance requirements. Rogelia Copping MD, MD 11/01/2023 12:04:41 PM This report has been signed electronically. Number of Addenda: 0 Note Initiated On: 11/01/2023 11:41 AM Scope Withdrawal Time: 0 hours 7 minutes 14 seconds  Total Procedure Duration: 0 hours 9 minutes 21  seconds  Estimated Blood Loss:  Estimated blood loss: none.      [facility]

## 2023-11-02 ENCOUNTER — Encounter: Payer: Self-pay | Admitting: Gastroenterology

## 2024-01-17 ENCOUNTER — Other Ambulatory Visit: Payer: Self-pay | Admitting: Urology

## 2024-01-19 ENCOUNTER — Other Ambulatory Visit: Payer: Self-pay | Admitting: Urology

## 2024-01-24 ENCOUNTER — Encounter: Payer: Self-pay | Admitting: Urology

## 2024-01-24 ENCOUNTER — Ambulatory Visit: Admitting: Urology

## 2024-01-24 VITALS — BP 161/78 | HR 65 | Ht 66.0 in | Wt 241.0 lb

## 2024-01-24 DIAGNOSIS — N401 Enlarged prostate with lower urinary tract symptoms: Secondary | ICD-10-CM | POA: Diagnosis not present

## 2024-01-24 DIAGNOSIS — R3911 Hesitancy of micturition: Secondary | ICD-10-CM

## 2024-01-24 LAB — URINALYSIS, COMPLETE
Bilirubin, UA: NEGATIVE
Glucose, UA: NEGATIVE
Ketones, UA: NEGATIVE
Leukocytes,UA: NEGATIVE
Nitrite, UA: NEGATIVE
RBC, UA: NEGATIVE
Specific Gravity, UA: 1.025 (ref 1.005–1.030)
Urobilinogen, Ur: 0.2 mg/dL (ref 0.2–1.0)
pH, UA: 6 (ref 5.0–7.5)

## 2024-01-24 LAB — BLADDER SCAN AMB NON-IMAGING: Scan Result: 0

## 2024-01-24 LAB — MICROSCOPIC EXAMINATION

## 2024-01-24 MED ORDER — TAMSULOSIN HCL 0.4 MG PO CAPS: 0.8000 mg | ORAL_CAPSULE | Freq: Every day | ORAL | 3 refills | Status: DC

## 2024-01-24 MED ORDER — TAMSULOSIN HCL 0.4 MG PO CAPS
0.8000 mg | ORAL_CAPSULE | Freq: Every day | ORAL | 3 refills | Status: AC
Start: 2024-01-24 — End: ?

## 2024-01-24 MED ORDER — TAMSULOSIN HCL 0.4 MG PO CAPS: 0.8000 mg | ORAL_CAPSULE | Freq: Every day | ORAL | 1 refills | Status: DC

## 2024-01-24 NOTE — Progress Notes (Signed)
 01/24/2024 1:07 PM   Bryan Shah Course 1950/07/17 [id number]  Referring provider: Adina Buel HERO, MD [address] [place],  [place] [address]  Chief Complaint  Patient presents with   Follow-up   Benign Prostatic Hypertrophy    HPI: Bryan Shah is a 73 y.o. male who presents for follow-up visit.  A [place] Spanish interpreter was present during this visit  Refer to my prior office note 08/31/2023 He did see improvement in his voiding pattern with titrating the tamsulosin  to 0.4 mg twice daily Denies dysuria, gross hematuria No flank, abdominal or pelvic pain IPSS today 15/35.     PMH: Past Medical History:  Diagnosis Date   Diabetes mellitus without complication (HCC)    Dysrhythmia    Hypertension     Surgical History: Past Surgical History:  Procedure Laterality Date   COLONOSCOPY N/A 11/01/2023   Procedure: COLONOSCOPY;  Surgeon: Jinny Carmine, MD;  Location: [facility] ENDOSCOPY;  Service: Endoscopy;  Laterality: N/A;  SPANISH INTERPRETER   NO PAST SURGERIES      Home Medications:  Allergies as of 01/24/2024   No Known Allergies      Medication List        Accurate as of January 24, 2024  1:07 PM. If you have any questions, ask your nurse or doctor.          Aspirin Low Dose 81 MG tablet Generic drug: aspirin EC Take 81 mg by mouth daily.   atorvastatin 40 MG tablet Commonly known as: LIPITOR Take 40 mg by mouth daily.   baclofen 10 MG tablet Commonly known as: LIORESAL Take 5 mg by mouth 3 (three) times daily.   DULoxetine 60 MG capsule Commonly known as: CYMBALTA Take 60 mg by mouth daily.   losartan 50 MG tablet Commonly known as: COZAAR Take 50 mg by mouth daily.   losartan-hydrochlorothiazide 50-12.5 MG tablet Commonly known as: HYZAAR LOSARTAN POTASSIUM-HCTZ 50-12.5 MG TABS   meloxicam 15 MG tablet Commonly known as: MOBIC Take 15 mg by mouth daily.   metFORMIN 500 MG tablet Commonly known as:  GLUCOPHAGE Take 500 mg by mouth 2 (two) times daily.   nystatin  cream Commonly known as: MYCOSTATIN  Apply 1 Application topically 2 (two) times daily.   pregabalin 75 MG capsule Commonly known as: LYRICA Take 75 mg by mouth 2 (two) times daily.   spironolactone 25 MG tablet Commonly known as: ALDACTONE SPIRONOLACTONE 25 MG TABS   tadalafil 20 MG tablet Commonly known as: CIALIS Take 20 mg by mouth daily as needed for erectile dysfunction (take 30-45 minutes prior to need).   tamsulosin  0.4 MG Caps capsule Commonly known as: FLOMAX  Take 2 capsules (0.8 mg total) by mouth daily.        Allergies: No Known Allergies  Family History: No family history on file.  Social History:  reports that he has never smoked. He has never used smokeless tobacco. He reports that he does not drink alcohol and does not use drugs.   Physical Exam: BP (!) 161/78   Pulse 65   Ht 5' 6 (1.676 m)   Wt 241 lb (109.3 kg)   BMI 38.90 kg/m   Constitutional:  Alert, No acute distress. HEENT: [place] AT Respiratory: Normal respiratory effort, no increased work of breathing. Psychiatric: Normal mood and affect.  Laboratory Data:  Urinalysis Dipstick/microscopy negative    Assessment & Plan:    1. Benign prostatic hyperplasia with urinary hesitancy PVR today 0 mL We discussed options  of continuing tamsulosin ; adding a 5-ARI medication and surgical options.  He states his voiding symptoms are presently not bothersome and he desires to continue tamsulosin  Rx refill 1 year follow-up with PVR and instructed call earlier for any worsening voiding symptoms   Glendia JAYSON Barba, MD  [facility] [address] [place], [place] [address] [phone]

## 2025-01-22 ENCOUNTER — Ambulatory Visit: Admitting: Urology
